# Patient Record
Sex: Female | Born: 1985 | Race: Black or African American | Hispanic: No | Marital: Single | Smoking: Never smoker
Health system: Southern US, Community
[De-identification: ages and names within clinical notes are randomized; demographics above are authoritative.]

## PROBLEM LIST (undated history)

## (undated) DIAGNOSIS — I10 Essential (primary) hypertension: Secondary | ICD-10-CM

## (undated) DIAGNOSIS — J45909 Unspecified asthma, uncomplicated: Secondary | ICD-10-CM

## (undated) DIAGNOSIS — E669 Obesity, unspecified: Secondary | ICD-10-CM

## (undated) DIAGNOSIS — E119 Type 2 diabetes mellitus without complications: Secondary | ICD-10-CM

## (undated) HISTORY — DX: Obesity, unspecified: E66.9

---

## 2007-05-10 ENCOUNTER — Emergency Department (HOSPITAL_COMMUNITY): Admission: EM | Admit: 2007-05-10 | Discharge: 2007-05-11 | Payer: Self-pay | Admitting: Emergency Medicine

## 2009-05-04 ENCOUNTER — Emergency Department (HOSPITAL_COMMUNITY): Admission: EM | Admit: 2009-05-04 | Discharge: 2009-05-04 | Payer: Self-pay | Admitting: Emergency Medicine

## 2009-07-21 ENCOUNTER — Emergency Department (HOSPITAL_COMMUNITY): Admission: EM | Admit: 2009-07-21 | Discharge: 2009-07-21 | Payer: Self-pay | Admitting: Emergency Medicine

## 2010-08-22 ENCOUNTER — Emergency Department (HOSPITAL_COMMUNITY)
Admission: EM | Admit: 2010-08-22 | Discharge: 2010-08-22 | Payer: Self-pay | Source: Home / Self Care | Admitting: Emergency Medicine

## 2010-11-30 LAB — CBC
HCT: 40.5 % (ref 36.0–46.0)
Hemoglobin: 13.7 g/dL (ref 12.0–15.0)
MCV: 80.4 fL (ref 78.0–100.0)
Platelets: 182 10*3/uL (ref 150–400)
RBC: 5.04 MIL/uL (ref 3.87–5.11)
WBC: 6.1 10*3/uL (ref 4.0–10.5)

## 2010-11-30 LAB — COMPREHENSIVE METABOLIC PANEL
BUN: 8 mg/dL (ref 6–23)
CO2: 24 mEq/L (ref 19–32)
Chloride: 105 mEq/L (ref 96–112)
Creatinine, Ser: 0.73 mg/dL (ref 0.4–1.2)
GFR calc non Af Amer: >60 mL/min (ref >60)
Glucose, Bld: 96 mg/dL (ref 70–99)
Total Bilirubin: 0.8 mg/dL (ref 0.3–1.2)

## 2010-11-30 LAB — DIFFERENTIAL
Basophils Absolute: 0 10*3/uL (ref 0.0–0.1)
Basophils Relative: 0 % (ref 0–1)
Lymphocytes Relative: 13 % (ref 12–46)
Neutro Abs: 5.1 10*3/uL (ref 1.7–7.7)
Neutrophils Relative %: 83 % — ABNORMAL HIGH (ref 43–77)

## 2010-11-30 LAB — URINALYSIS, ROUTINE W REFLEX MICROSCOPIC
Glucose, UA: NEGATIVE mg/dL
Hgb urine dipstick: NEGATIVE
Protein, ur: NEGATIVE mg/dL

## 2010-11-30 LAB — LIPASE, BLOOD: Lipase: 14 U/L (ref 11–59)

## 2010-12-02 LAB — WET PREP, GENITAL
WBC, Wet Prep HPF POC: NONE SEEN
Yeast Wet Prep HPF POC: NONE SEEN

## 2010-12-02 LAB — DIFFERENTIAL
Basophils Absolute: 0.1 10*3/uL (ref 0.0–0.1)
Basophils Relative: 2 % — ABNORMAL HIGH (ref 0–1)
Eosinophils Absolute: 0 10*3/uL (ref 0.0–0.7)
Monocytes Relative: 8 % (ref 3–12)
Neutro Abs: 1.2 10*3/uL — ABNORMAL LOW (ref 1.7–7.7)
Neutrophils Relative %: 34 % — ABNORMAL LOW (ref 43–77)

## 2010-12-02 LAB — URINALYSIS, ROUTINE W REFLEX MICROSCOPIC
Hgb urine dipstick: NEGATIVE
Ketones, ur: NEGATIVE mg/dL
Protein, ur: NEGATIVE mg/dL
Urobilinogen, UA: 1 mg/dL (ref 0.0–1.0)

## 2010-12-02 LAB — CBC
MCHC: 33.9 g/dL (ref 30.0–36.0)
MCV: 80.8 fL (ref 78.0–100.0)
Platelets: 200 10*3/uL (ref 150–400)
RBC: 4.49 MIL/uL (ref 3.87–5.11)

## 2010-12-02 LAB — GC/CHLAMYDIA PROBE AMP, GENITAL
Chlamydia, DNA Probe: POSITIVE — AB
GC Probe Amp, Genital: NEGATIVE

## 2010-12-02 LAB — BASIC METABOLIC PANEL
BUN: 11 mg/dL (ref 6–23)
CO2: 31 mEq/L (ref 19–32)
Calcium: 9 mg/dL (ref 8.4–10.5)
Creatinine, Ser: 0.78 mg/dL (ref 0.4–1.2)
GFR calc Af Amer: >60 mL/min (ref >60)

## 2011-09-12 ENCOUNTER — Encounter (HOSPITAL_COMMUNITY): Payer: Self-pay | Admitting: *Deleted

## 2011-09-12 ENCOUNTER — Emergency Department (HOSPITAL_COMMUNITY)
Admission: EM | Admit: 2011-09-12 | Discharge: 2011-09-12 | Disposition: A | Discharge status: Home / Self Care | Payer: Self-pay | Attending: Emergency Medicine | Admitting: Emergency Medicine

## 2011-09-12 DIAGNOSIS — K029 Dental caries, unspecified: Secondary | ICD-10-CM | POA: Insufficient documentation

## 2011-09-12 DIAGNOSIS — R51 Headache: Secondary | ICD-10-CM | POA: Insufficient documentation

## 2011-09-12 DIAGNOSIS — K0889 Other specified disorders of teeth and supporting structures: Secondary | ICD-10-CM

## 2011-09-12 DIAGNOSIS — K089 Disorder of teeth and supporting structures, unspecified: Secondary | ICD-10-CM | POA: Insufficient documentation

## 2011-09-12 MED ORDER — AMOXICILLIN 500 MG PO CAPS: 500.0000 mg | ORAL_CAPSULE | Freq: Three times a day (TID) | ORAL | Status: AC

## 2011-09-12 MED ORDER — OXYCODONE-ACETAMINOPHEN 5-325 MG PO TABS: 1.0000 | ORAL_TABLET | ORAL | Status: AC | PRN

## 2011-09-12 MED ORDER — OXYCODONE-ACETAMINOPHEN 5-325 MG PO TABS
1.0000 | ORAL_TABLET | Freq: Once | ORAL | Status: AC
  Administered 2011-09-12: 1 via ORAL
  Filled 2011-09-12: qty 1

## 2011-09-12 NOTE — ED Notes (Signed)
States that she has three teeth with holes and c/o pain to areas and headaches as well, ongoing for 3 months, last seen dentist last year

## 2011-09-13 NOTE — ED Provider Notes (Signed)
History     CSN: [id number]  Arrival date & time 09/12/11  1314   First MD Initiated Contact with Patient 09/12/11 1401      Chief Complaint  Patient presents with  . Dental Pain    (Consider location/radiation/quality/duration/timing/severity/associated sxs/prior treatment) Patient is a 26 y.o. female presenting with tooth pain. The history is provided by the patient. No language interpreter was used.  Dental PainThe primary symptoms include mouth pain and headaches. Primary symptoms do not include dental injury, oral bleeding, oral lesions, fever, shortness of breath, sore throat, angioedema or cough. The symptoms began more than 1 month ago. The symptoms are worsening. The symptoms are recurrent. The symptoms occur constantly.  Mouth pain began more than 1 month ago. Mouth pain occurs frequently. Mouth pain is unchanged. Affected locations include: teeth and gum(s).  The headache is not associated with neck stiffness.   Additional symptoms include: dental sensitivity to temperature and gum tenderness. Additional symptoms do not include: gum swelling, purulent gums, trismus, facial swelling, trouble swallowing, ear pain and swollen glands. Associated symptoms comments: Intermittent headaches.. Medical issues include: smoking and periodontal disease.    History reviewed. No pertinent past medical history.  History reviewed. No pertinent past surgical history.  History reviewed. No pertinent family history.  History  Substance Use Topics  . Smoking status: Current Everyday Smoker -- 0.2 packs/day    Types: Cigarettes  . Smokeless tobacco: Not on file  . Alcohol Use: No    OB History    Grav Para Term Preterm Abortions TAB SAB Ect Mult Living                  Review of Systems  Constitutional: Negative for fever and appetite change.  HENT: Positive for dental problem. Negative for ear pain, sore throat, facial swelling, trouble swallowing, neck pain and neck stiffness.     Respiratory: Negative for cough and shortness of breath.   Skin: Negative.   Neurological: Positive for headaches. Negative for dizziness.  Hematological: Negative for adenopathy.  All other systems reviewed and are negative.    Allergies  Review of patient's allergies indicates no known allergies.  Home Medications   Current Outpatient Rx  Name Route Sig Dispense Refill  . AMOXICILLIN 500 MG PO CAPS Oral Take 1 capsule (500 mg total) by mouth 3 (three) times daily. For 10 days 30 capsule 0  . OXYCODONE-ACETAMINOPHEN 5-325 MG PO TABS Oral Take 1 tablet by mouth every 4 (four) hours as needed for pain. 15 tablet 0    BP 155/89  Pulse 97  Temp 98.7 F (37.1 C)  Resp 18  Ht 5\' 9"  (1.753 m)  Wt 280 lb (127.007 kg)  BMI 41.35 kg/m2  SpO2 99%  LMP 08/14/2011  Physical Exam  Nursing note and vitals reviewed. Constitutional: She is oriented to person, place, and time.  HENT:  Head: Normocephalic and atraumatic. No trismus in the jaw.  Right Ear: Tympanic membrane and ear canal normal.  Left Ear: Tympanic membrane and ear canal normal.  Mouth/Throat: Uvula is midline, oropharynx is clear and moist and mucous membranes are normal. Dental caries present. No dental abscesses or uvula swelling.       Multiple dental caries with widespread dental disease, discoloration of several teeth.  No obvious dental abscess, no facial swelling or trismus  Eyes: EOM are normal. Pupils are equal, round, and reactive to light.  Neck: Normal range of motion. Neck supple. No Brudzinski's sign and no Kernig's sign  noted. No thyromegaly present.  Cardiovascular: Normal rate, regular rhythm and normal heart sounds.   No murmur heard. Pulmonary/Chest: Effort normal and breath sounds normal.  Lymphadenopathy:    She has no cervical adenopathy.  Neurological: She is alert and oriented to person, place, and time. She exhibits normal muscle tone. Coordination normal.  Skin: Skin is warm and dry.    ED  Course  Procedures (including critical care time)    1. Pain, dental       MDM   Chronic dental pain with multiple dental caries.  Vitals stable.  Non-toxic appearing.  No meningeal signs.  Headaches are likely secondary to dental pain.         Ivin Rosenbloom L. [place], [place] 09/13/11 2015

## 2011-09-18 NOTE — ED Provider Notes (Signed)
Medical screening examination/treatment/procedure(s) were performed by non-physician practitioner and as supervising physician I was immediately available for consultation/collaboration.  Sammie Denner S. Jashay Roddy, MD 09/18/11 1011 

## 2012-03-06 ENCOUNTER — Emergency Department (HOSPITAL_COMMUNITY)
Admission: EM | Admit: 2012-03-06 | Discharge: 2012-03-06 | Disposition: A | Discharge status: Home / Self Care | Payer: Medicaid Other | Attending: Emergency Medicine | Admitting: Emergency Medicine

## 2012-03-06 ENCOUNTER — Emergency Department (HOSPITAL_COMMUNITY): Payer: Medicaid Other

## 2012-03-06 ENCOUNTER — Encounter (HOSPITAL_COMMUNITY): Payer: Self-pay | Admitting: *Deleted

## 2012-03-06 DIAGNOSIS — R51 Headache: Secondary | ICD-10-CM | POA: Insufficient documentation

## 2012-03-06 DIAGNOSIS — Z79899 Other long term (current) drug therapy: Secondary | ICD-10-CM | POA: Insufficient documentation

## 2012-03-06 DIAGNOSIS — F17200 Nicotine dependence, unspecified, uncomplicated: Secondary | ICD-10-CM | POA: Insufficient documentation

## 2012-03-06 LAB — BASIC METABOLIC PANEL
BUN: 12 mg/dL (ref 6–23)
Calcium: 9.2 mg/dL (ref 8.4–10.5)
Chloride: 105 mEq/L (ref 96–112)
Creatinine, Ser: 0.89 mg/dL (ref 0.50–1.10)
GFR calc Af Amer: >90 mL/min (ref >90)

## 2012-03-06 LAB — CBC WITH DIFFERENTIAL/PLATELET
Basophils Absolute: 0 10*3/uL (ref 0.0–0.1)
Basophils Relative: 1 % (ref 0–1)
Eosinophils Absolute: 0.1 10*3/uL (ref 0.0–0.7)
Eosinophils Relative: 1 % (ref 0–5)
HCT: 39 % (ref 36.0–46.0)
Hemoglobin: 12.7 g/dL (ref 12.0–15.0)
MCH: 26.4 pg (ref 26.0–34.0)
MCHC: 32.6 g/dL (ref 30.0–36.0)
MCV: 81.1 fL (ref 78.0–100.0)
Monocytes Absolute: 0.3 10*3/uL (ref 0.1–1.0)
Monocytes Relative: 7 % (ref 3–12)
RDW: 13.2 % (ref 11.5–15.5)

## 2012-03-06 LAB — URINALYSIS, ROUTINE W REFLEX MICROSCOPIC
Bilirubin Urine: NEGATIVE
Ketones, ur: NEGATIVE mg/dL
Leukocytes, UA: NEGATIVE
Nitrite: NEGATIVE
Protein, ur: NEGATIVE mg/dL
pH: 6 (ref 5.0–8.0)

## 2012-03-06 MED ORDER — SODIUM CHLORIDE 0.9 % IV SOLN: INTRAVENOUS | Status: DC

## 2012-03-06 MED ORDER — HYDROCODONE-ACETAMINOPHEN 5-325 MG PO TABS: 1.0000 | ORAL_TABLET | Freq: Four times a day (QID) | ORAL | Status: AC | PRN

## 2012-03-06 MED ORDER — ONDANSETRON HCL 4 MG/2ML IJ SOLN
4.0000 mg | Freq: Once | INTRAMUSCULAR | Status: AC
  Administered 2012-03-06: 4 mg via INTRAVENOUS
  Filled 2012-03-06: qty 2

## 2012-03-06 MED ORDER — NAPROXEN 500 MG PO TABS: 500.0000 mg | ORAL_TABLET | Freq: Two times a day (BID) | ORAL | Status: DC

## 2012-03-06 MED ORDER — SODIUM CHLORIDE 0.9 % IV BOLUS (SEPSIS)
250.0000 mL | Freq: Once | INTRAVENOUS | Status: AC
  Administered 2012-03-06: 1000 mL via INTRAVENOUS

## 2012-03-06 NOTE — ED Provider Notes (Signed)
History  This chart was scribed for Shelda Jakes, MD by Erskine Emery. This patient was seen in room [facility]09/[facility]09 and the patient's care was started at 11:32.   CSN: [id number]  Arrival date & time 03/06/12  1106   First MD Initiated Contact with Patient 03/06/12 1132      Chief Complaint  Patient presents with  . Headache    (Consider location/radiation/quality/duration/timing/severity/associated sxs/prior treatment) HPI  Ruth Campbell is a 26 y.o. female who presents to the Emergency Department complaining of an intermittent frontal (equal bilaterally) throbbing headache of 6-7/10 severity for almost a month that gets worse and constant at night. Pt denies any associated nausea, diarrhea, vominting, fever, sore throat, congestion, visual changes, photophobia, chest pain, SOB, cough, abdominal pain, dysuria, rash, or neck pain, but reports associated intermittent back pain for about 2 days. Pt denies a h/o migraines, head injury, or bleeding easily but reports a h/o mild sinus headaches. Pt has been given medication since arriving to the ED with no relief.  Dr. Dimas Aguas is pt's PCP.    History reviewed. No pertinent past medical history.  History reviewed. No pertinent past surgical history.  History reviewed. No pertinent family history.  History  Substance Use Topics  . Smoking status: Current Everyday Smoker -- 0.2 packs/day    Types: Cigarettes  . Smokeless tobacco: Not on file  . Alcohol Use: No    OB History    Grav Para Term Preterm Abortions TAB SAB Ect Mult Living                  Review of Systems  Constitutional: Negative for fever.  HENT: Negative for congestion, sore throat and neck pain.   Eyes: Negative for photophobia and visual disturbance.  Respiratory: Negative for cough and shortness of breath.   Cardiovascular: Negative for chest pain.  Gastrointestinal: Negative for nausea, vomiting, abdominal pain and diarrhea.  Genitourinary: Negative for  dysuria.  Neurological: Positive for headaches.  All other systems reviewed and are negative.    Allergies  Review of patient's allergies indicates no known allergies.  Home Medications   Current Outpatient Rx  Name Route Sig Dispense Refill  . HYDROCODONE-ACETAMINOPHEN 5-325 MG PO TABS Oral Take 1-2 tablets by mouth every 6 (six) hours as needed for pain. 10 tablet 0  . NAPROXEN 500 MG PO TABS Oral Take 1 tablet (500 mg total) by mouth 2 (two) times daily. 14 tablet 0    Triage Vitals: BP 153/132  Pulse 91  Temp 98.5 F (36.9 C) (Oral)  Resp 20  Ht 5\' 8"  (1.727 m)  Wt 250 lb (113.399 kg)  BMI 38.01 kg/m2  SpO2 97%  LMP 02/07/2012  Physical Exam  Nursing note and vitals reviewed. Constitutional: She is oriented to person, place, and time. She appears well-developed and well-nourished. No distress.  HENT:  Head: Normocephalic and atraumatic.  Eyes: EOM are normal. Pupils are equal, round, and reactive to light.  Neck: Neck supple. No tracheal deviation present.  Cardiovascular: Normal rate, regular rhythm and normal heart sounds.   No murmur heard.      High blood pressure.   Pulmonary/Chest: Effort normal and breath sounds normal. No respiratory distress.  Abdominal: Soft. Bowel sounds are normal. She exhibits no distension. There is no tenderness.  Musculoskeletal: Normal range of motion. She exhibits no edema.       No numbness or swelling in legs.   Lymphadenopathy:    She has no cervical adenopathy.  Neurological:  She is alert and oriented to person, place, and time. No cranial nerve deficit. Coordination normal.  Skin: Skin is warm and dry.  Psychiatric: She has a normal mood and affect. Her behavior is normal.    ED Course  Procedures (including critical care time)  DIAGNOSTIC STUDIES: Oxygen Saturation is 97% on room air, adequate by my interpretation.    COORDINATION OF CARE:  12:50--I discussed treatment plan including pain medication with pt and pt  agreed.    Labs Reviewed  CBC WITH DIFFERENTIAL - Abnormal; Notable for the following:    WBC 3.7 (*)     Neutrophils Relative 38 (*)     Neutro Abs 1.4 (*)     Lymphocytes Relative 52 (*)     All other components within normal limits  BASIC METABOLIC PANEL - Abnormal; Notable for the following:    Glucose, Bld 109 (*)     GFR calc non Af Amer 90 (*)     All other components within normal limits  URINALYSIS, ROUTINE W REFLEX MICROSCOPIC  PREGNANCY, URINE   Ct Head Wo Contrast  03/06/2012  *RADIOLOGY REPORT*  Clinical Data: Headache.  No injury.  CT HEAD WITHOUT CONTRAST  Technique:  Contiguous axial images were obtained from the base of the skull through the vertex without contrast.  Comparison: None.  Findings: No mass lesion, mass effect, midline shift, hydrocephalus, hemorrhage.  No territorial ischemia or acute infarction.  Left mastoid fluid is present with sclerosis of the left mastoid bone suggesting remote mastoiditis.  IMPRESSION: No acute intracranial abnormality.  Original Report Authenticated By: Andreas Newport, M.D.   Results for orders placed during the hospital encounter of 03/06/12  CBC WITH DIFFERENTIAL      Component Value Range   WBC 3.7 (*) 4.0 - 10.5 K/uL   RBC 4.81  3.87 - 5.11 MIL/uL   Hemoglobin 12.7  12.0 - 15.0 g/dL   HCT 95.6  21.3 - 08.6 %   MCV 81.1  78.0 - 100.0 fL   MCH 26.4  26.0 - 34.0 pg   MCHC 32.6  30.0 - 36.0 g/dL   RDW 57.8  46.9 - 62.9 %   Platelets 198  150 - 400 K/uL   Neutrophils Relative 38 (*) 43 - 77 %   Neutro Abs 1.4 (*) 1.7 - 7.7 K/uL   Lymphocytes Relative 52 (*) 12 - 46 %   Lymphs Abs 1.9  0.7 - 4.0 K/uL   Monocytes Relative 7  3 - 12 %   Monocytes Absolute 0.3  0.1 - 1.0 K/uL   Eosinophils Relative 1  0 - 5 %   Eosinophils Absolute 0.1  0.0 - 0.7 K/uL   Basophils Relative 1  0 - 1 %   Basophils Absolute 0.0  0.0 - 0.1 K/uL  BASIC METABOLIC PANEL      Component Value Range   Sodium 139  135 - 145 mEq/L   Potassium 4.0   3.5 - 5.1 mEq/L   Chloride 105  96 - 112 mEq/L   CO2 25  19 - 32 mEq/L   Glucose, Bld 109 (*) 70 - 99 mg/dL   BUN 12  6 - 23 mg/dL   Creatinine, Ser 5.28  0.50 - 1.10 mg/dL   Calcium 9.2  8.4 - 41.3 mg/dL   GFR calc non Af Amer 90 (*) >90 mL/min   GFR calc Af Amer >90  >90 mL/min  URINALYSIS, ROUTINE W REFLEX MICROSCOPIC  Component Value Range   Color, Urine YELLOW  YELLOW   APPearance CLEAR  CLEAR   Specific Gravity, Urine 1.030  1.005 - 1.030   pH 6.0  5.0 - 8.0   Glucose, UA NEGATIVE  NEGATIVE mg/dL   Hgb urine dipstick NEGATIVE  NEGATIVE   Bilirubin Urine NEGATIVE  NEGATIVE   Ketones, ur NEGATIVE  NEGATIVE mg/dL   Protein, ur NEGATIVE  NEGATIVE mg/dL   Urobilinogen, UA 0.2  0.0 - 1.0 mg/dL   Nitrite NEGATIVE  NEGATIVE   Leukocytes, UA NEGATIVE  NEGATIVE  PREGNANCY, URINE      Component Value Range   Preg Test, Ur NEGATIVE  NEGATIVE     1. Headache       MDM  Workup negative negative head CT urinalysis normal no pregnancy electrolytes without any sniffing abnormalities no leukocytosis no significant anemia. Patient followup with her primary care Dr. If symptoms persist will most likely which they have been require an MRI as an outpatient. No evidence of sinusitis no evidence of acute intracranial problem. MRI of the warned to rule out neoplastic process. Patient in no acute distress in the emergency department nontoxic.  I personally performed the services described in this documentation, which was scribed in my presence. The recorded information has been reviewed and considered.          Shelda Jakes, MD 03/06/12 [phone]

## 2012-03-06 NOTE — ED Notes (Signed)
Frontal headache x 1 month. Increases at night,.  No N/V  No injury.  No hx of migraines.

## 2012-03-06 NOTE — ED Notes (Signed)
Pt stated she was feeling a little better, pointed to pain on left side of head.  Friend at bedside.

## 2012-03-06 NOTE — ED Notes (Signed)
Pt given cup of water, delay explained

## 2012-04-11 ENCOUNTER — Encounter (HOSPITAL_COMMUNITY): Payer: Self-pay | Admitting: *Deleted

## 2012-04-11 ENCOUNTER — Emergency Department (HOSPITAL_COMMUNITY)
Admission: EM | Admit: 2012-04-11 | Discharge: 2012-04-11 | Disposition: A | Discharge status: Home / Self Care | Payer: Medicaid Other | Attending: Emergency Medicine | Admitting: Emergency Medicine

## 2012-04-11 DIAGNOSIS — F17200 Nicotine dependence, unspecified, uncomplicated: Secondary | ICD-10-CM | POA: Insufficient documentation

## 2012-04-11 DIAGNOSIS — R51 Headache: Secondary | ICD-10-CM | POA: Insufficient documentation

## 2012-04-11 DIAGNOSIS — J069 Acute upper respiratory infection, unspecified: Secondary | ICD-10-CM | POA: Insufficient documentation

## 2012-04-11 MED ORDER — OXYMETAZOLINE HCL 0.05 % NA SOLN
1.0000 | Freq: Once | NASAL | Status: AC
  Administered 2012-04-11: 1 via NASAL
  Filled 2012-04-11: qty 15

## 2012-04-11 MED ORDER — LORATADINE 10 MG PO TABS
10.0000 mg | ORAL_TABLET | Freq: Every day | ORAL | Status: DC
  Administered 2012-04-11: 10 mg via ORAL
  Filled 2012-04-11: qty 1

## 2012-04-11 MED ORDER — PSEUDOEPHEDRINE HCL 60 MG PO TABS
60.0000 mg | ORAL_TABLET | Freq: Once | ORAL | Status: AC
  Administered 2012-04-11: 60 mg via ORAL
  Filled 2012-04-11: qty 1

## 2012-04-11 MED ORDER — IBUPROFEN 800 MG PO TABS
800.0000 mg | ORAL_TABLET | Freq: Once | ORAL | Status: AC
  Administered 2012-04-11: 800 mg via ORAL
  Filled 2012-04-11: qty 1

## 2012-04-11 NOTE — ED Provider Notes (Signed)
History     CSN: [id number]  Arrival date & time 04/11/12  2016   First MD Initiated Contact with Patient 04/11/12 2238      Chief Complaint  Patient presents with  . Headache    (Consider location/radiation/quality/duration/timing/severity/associated sxs/prior treatment) HPI Comments: Has been having "cold sxs" ~3 weeks.  No fever or  Chills.  Headache has been present x 3 days.  It is diffuse and dull..  She partially attributes it to her seasonal headaches.    Patient is a 26 y.o. female presenting with headaches. The history is provided by the patient. No language interpreter was used.  Headache  This is a new problem. The problem occurs constantly. The headache is associated with nothing. The quality of the pain is described as throbbing. The pain does not radiate. Pertinent negatives include no fever, no nausea and no vomiting. She has tried acetaminophen for the symptoms. The treatment provided no relief.    History reviewed. No pertinent past medical history.  History reviewed. No pertinent past surgical history.  History reviewed. No pertinent family history.  History  Substance Use Topics  . Smoking status: Current Everyday Smoker -- 0.2 packs/day    Types: Cigarettes  . Smokeless tobacco: Not on file  . Alcohol Use: No    OB History    Grav Para Term Preterm Abortions TAB SAB Ect Mult Living                  Review of Systems  Constitutional: Negative for fever.  HENT: Positive for congestion. Negative for ear pain, sore throat, sneezing and trouble swallowing.   Eyes: Negative for photophobia.  Respiratory: Negative for cough.   Gastrointestinal: Negative for nausea, vomiting and diarrhea.  Neurological: Positive for headaches.  All other systems reviewed and are negative.    Allergies  Review of patient's allergies indicates no known allergies.  Home Medications   Current Outpatient Rx  Name Route Sig Dispense Refill  . ACETAMINOPHEN 500 MG PO  TABS Oral Take 1,000 mg by mouth as needed.      BP 160/93  Pulse 76  Temp 98.1 F (36.7 C) (Oral)  Resp 20  Ht 5\' 7"  (1.702 m)  Wt 250 lb (113.399 kg)  BMI 39.16 kg/m2  SpO2 100%  LMP 02/19/2012  Physical Exam  Nursing note and vitals reviewed. Constitutional: She is oriented to person, place, and time. She appears well-developed and well-nourished. No distress.  HENT:  Head: Normocephalic and atraumatic.  Right Ear: Hearing, tympanic membrane, external ear and ear canal normal.  Left Ear: Hearing, tympanic membrane, external ear and ear canal normal.  Nose: No rhinorrhea.  Mouth/Throat: Uvula is midline and oropharynx is clear and moist. No uvula swelling.       Nasal congestion   Eyes: EOM are normal.  Neck: Trachea normal and normal range of motion.  Cardiovascular: Normal rate, regular rhythm and normal heart sounds.   Pulmonary/Chest: Effort normal and breath sounds normal. No accessory muscle usage. No respiratory distress.  Abdominal: Soft. She exhibits no distension. There is no tenderness.  Musculoskeletal: Normal range of motion.  Neurological: She is alert and oriented to person, place, and time.  Skin: Skin is warm and dry.  Psychiatric: She has a normal mood and affect. Judgment normal.    ED Course  Procedures (including critical care time)  Labs Reviewed - No data to display No results found.   1. Headache   2. URI (upper respiratory infection)  MDM  Sudafed claritin Afrin spray.        Evalina Field, [place] 04/11/12 2337

## 2012-04-11 NOTE — ED Notes (Signed)
Pt c/o headache, nasal congestion, and sore throat. Pt denies cough and NVD. Pt states symptoms have been present four about a week.

## 2012-04-11 NOTE — ED Notes (Signed)
Nasal congestion, headache, sl sore throat, no cough, No N/V

## 2012-04-12 NOTE — ED Provider Notes (Signed)
Medical screening examination/treatment/procedure(s) were performed by non-physician practitioner and as supervising physician I was immediately available for consultation/collaboration. Devoria Albe, MD, Armando Gang   Ward Givens, MD 04/12/12 [phone]

## 2012-06-19 ENCOUNTER — Encounter (HOSPITAL_COMMUNITY): Payer: Self-pay | Admitting: *Deleted

## 2012-06-19 ENCOUNTER — Emergency Department (HOSPITAL_COMMUNITY)
Admission: EM | Admit: 2012-06-19 | Discharge: 2012-06-19 | Disposition: A | Discharge status: Home / Self Care | Payer: Medicaid Other | Attending: Emergency Medicine | Admitting: Emergency Medicine

## 2012-06-19 DIAGNOSIS — F17200 Nicotine dependence, unspecified, uncomplicated: Secondary | ICD-10-CM | POA: Insufficient documentation

## 2012-06-19 DIAGNOSIS — N390 Urinary tract infection, site not specified: Secondary | ICD-10-CM

## 2012-06-19 LAB — PREGNANCY, URINE: Preg Test, Ur: NEGATIVE

## 2012-06-19 LAB — URINALYSIS, ROUTINE W REFLEX MICROSCOPIC
Hgb urine dipstick: NEGATIVE
Leukocytes, UA: NEGATIVE
Nitrite: POSITIVE — AB
Protein, ur: NEGATIVE mg/dL
Specific Gravity, Urine: 1.025 (ref 1.005–1.030)
Urobilinogen, UA: 0.2 mg/dL (ref 0.0–1.0)

## 2012-06-19 LAB — WET PREP, GENITAL: Clue Cells Wet Prep HPF POC: NONE SEEN

## 2012-06-19 LAB — URINE MICROSCOPIC-ADD ON

## 2012-06-19 MED ORDER — CEPHALEXIN 500 MG PO CAPS: 500.0000 mg | ORAL_CAPSULE | Freq: Four times a day (QID) | ORAL | Status: DC

## 2012-06-19 MED ORDER — NAPROXEN 250 MG PO TABS: 250.0000 mg | ORAL_TABLET | Freq: Two times a day (BID) | ORAL | Status: DC

## 2012-06-19 NOTE — ED Notes (Signed)
abd and lower back pain, for 2 days.  Has ?lesion on lt 5th finger she wants checked.Nausea, with vomiting this am, no diarrhea

## 2012-06-19 NOTE — ED Provider Notes (Signed)
History     CSN: [id number]  Arrival date & time 06/19/12  1337   First MD Initiated Contact with Patient 06/19/12 1354      Chief Complaint  Patient presents with  . Abdominal Pain     HPI Pt was seen at 1355.  Per pt, c/o gradual onset and persistence of constant lower abd "pain" for the past 2 days.  Has been associated with one episode of N/V this morning.  Denies flank pain, no diarrhea, no CP/SOB, no fevers, no rash, no vaginal bleeding/discharge, no black or blood in emesis.    History reviewed. No pertinent past medical history.  History reviewed. No pertinent past surgical history.   History  Substance Use Topics  . Smoking status: Current Every Day Smoker -- 0.2 packs/day    Types: Cigarettes  . Smokeless tobacco: Not on file  . Alcohol Use: No    Review of Systems ROS: Statement: All systems negative except as marked or noted in the HPI; Constitutional: Negative for fever and chills. ; ; Eyes: Negative for eye pain, redness and discharge. ; ; ENMT: Negative for ear pain, hoarseness, nasal congestion, sinus pressure and sore throat. ; ; Cardiovascular: Negative for chest pain, palpitations, diaphoresis, dyspnea and peripheral edema. ; ; Respiratory: Negative for cough, wheezing and stridor. ; ; Gastrointestinal: +N/V, abd pain. Negative for diarrhea, blood in stool, hematemesis, jaundice and rectal bleeding. . ; ; Genitourinary: Negative for dysuria, flank pain and hematuria. ; ; GYN:  No vaginal bleeding, no vaginal discharge, no vulvar pain.;; Musculoskeletal: Negative for back pain and neck pain. Negative for swelling and trauma.; ; Skin: Negative for pruritus, rash, abrasions, blisters, bruising and skin lesion.; ; Neuro: Negative for headache, lightheadedness and neck stiffness. Negative for weakness, altered level of consciousness , altered mental status, extremity weakness, paresthesias, involuntary movement, seizure and syncope.       Allergies  Review of  patient's allergies indicates no known allergies.  Home Medications  No current outpatient prescriptions on file.  BP 150/99  Pulse 92  Temp 98.3 F (36.8 C) (Oral)  Resp 20  Ht 5\' 7"  (1.702 m)  Wt 250 lb (113.399 kg)  BMI 39.16 kg/m2  SpO2 99%  LMP 02/18/2012  Physical Exam 1400: Physical examination:  Nursing notes reviewed; Vital signs and O2 SAT reviewed;  Constitutional: Well developed, Well nourished, Well hydrated, In no acute distress; Head:  Normocephalic, atraumatic; Eyes: EOMI, PERRL, No scleral icterus; ENMT: Mouth and pharynx normal, Mucous membranes moist; Neck: Supple, Full range of motion, No lymphadenopathy; Cardiovascular: Regular rate and rhythm, No murmur, rub, or gallop; Respiratory: Breath sounds clear & equal bilaterally, No rales, rhonchi, wheezes.  Speaking full sentences with ease, Normal respiratory effort/excursion; Chest: Nontender, Movement normal; Abdomen: Soft, +very mild suprapubic tenderness to palp. Nondistended, Normal bowel sounds; Genitourinary: No CVA tenderness; Pelvic exam performed with permission of pt and female ED tech assist during exam.  External genitalia w/o lesions. Vaginal vault with white discharge.  Cervix w/o lesions, not friable, GC/chlam and wet prep obtained and sent to lab.  Bimanual exam w/o CMT, uterine or adnexal tenderness.;; Spine:  No midline CS, TS, LS tenderness.;;  Extremities: Pulses normal, No tenderness, No edema, No calf edema or asymmetry.; Neuro: AA&Ox3, Major CN grossly intact.  Speech clear. No gross focal motor or sensory deficits in extremities.; Skin: Color normal, Warm, Dry.   ED Course  Procedures    MDM  MDM Reviewed: nursing note and vitals Interpretation: labs  Results for orders placed during the hospital encounter of 06/19/12  PREGNANCY, URINE      Component Value Range   Preg Test, Ur NEGATIVE  NEGATIVE  URINALYSIS, ROUTINE W REFLEX MICROSCOPIC      Component Value Range   Color, Urine YELLOW   YELLOW   APPearance CLOUDY (*) CLEAR   Specific Gravity, Urine 1.025  1.005 - 1.030   pH 6.0  5.0 - 8.0   Glucose, UA NEGATIVE  NEGATIVE mg/dL   Hgb urine dipstick NEGATIVE  NEGATIVE   Bilirubin Urine NEGATIVE  NEGATIVE   Ketones, ur NEGATIVE  NEGATIVE mg/dL   Protein, ur NEGATIVE  NEGATIVE mg/dL   Urobilinogen, UA 0.2  0.0 - 1.0 mg/dL   Nitrite POSITIVE (*) NEGATIVE   Leukocytes, UA NEGATIVE  NEGATIVE  WET PREP, GENITAL      Component Value Range   Yeast Wet Prep HPF POC NONE SEEN  NONE SEEN   Trich, Wet Prep NONE SEEN  NONE SEEN   Clue Cells Wet Prep HPF POC NONE SEEN  NONE SEEN   WBC, Wet Prep HPF POC FEW (*) NONE SEEN  URINE MICROSCOPIC-ADD ON      Component Value Range   Squamous Epithelial / LPF FEW (*) RARE   WBC, UA 0-2  <3 WBC/hpf   Bacteria, UA MANY (*) RARE     1520:  Asking for me to look at her left 5th finger, states she has had "a bump on it for a while" that she "pops but it keeps coming back."  Left dorsal 5th finger PIP area with small superficial abrasion and what appears to be a wart. FROM without pain. No fluctuance, no drainage, no erythema, no edema, no tenderness along flexor or extensor tendon.  Will tx for UTI.  GC/chlam pending.  Abd continues benign on several re-exams. No CVAT bilat. No N/V while in the ED.  Pt wants to go home now.  Dx and testing d/w pt and family.  Questions answered.  Verb understanding, agreeable to d/c home with outpt f/u.            Laray Anger, DO 06/19/12 1936

## 2012-06-20 LAB — GC/CHLAMYDIA PROBE AMP, GENITAL
Chlamydia, DNA Probe: NEGATIVE
GC Probe Amp, Genital: NEGATIVE

## 2012-08-06 ENCOUNTER — Encounter (HOSPITAL_COMMUNITY): Payer: Self-pay | Admitting: *Deleted

## 2012-08-06 ENCOUNTER — Emergency Department (HOSPITAL_COMMUNITY)
Admission: EM | Admit: 2012-08-06 | Discharge: 2012-08-06 | Disposition: A | Discharge status: Home / Self Care | Payer: Medicaid Other | Attending: Emergency Medicine | Admitting: Emergency Medicine

## 2012-08-06 DIAGNOSIS — J029 Acute pharyngitis, unspecified: Secondary | ICD-10-CM | POA: Insufficient documentation

## 2012-08-06 DIAGNOSIS — B9789 Other viral agents as the cause of diseases classified elsewhere: Secondary | ICD-10-CM | POA: Insufficient documentation

## 2012-08-06 DIAGNOSIS — B349 Viral infection, unspecified: Secondary | ICD-10-CM

## 2012-08-06 DIAGNOSIS — Z87891 Personal history of nicotine dependence: Secondary | ICD-10-CM | POA: Insufficient documentation

## 2012-08-06 DIAGNOSIS — IMO0001 Reserved for inherently not codable concepts without codable children: Secondary | ICD-10-CM | POA: Insufficient documentation

## 2012-08-06 MED ORDER — HYDROCOD POLST-CHLORPHEN POLST 10-8 MG/5ML PO LQCR: 5.0000 mL | Freq: Two times a day (BID) | ORAL | Status: DC | PRN

## 2012-08-06 NOTE — ED Notes (Signed)
C/o productive cough, body aches, headache, and sore throat x 2 days. Denies nausea, but states has been coughing to the point of vomiting at times.

## 2012-08-06 NOTE — ED Provider Notes (Signed)
History     CSN: [id number]  Arrival date & time 08/06/12  1601   First MD Initiated Contact with Patient 08/06/12 1759      Chief Complaint  Patient presents with  . Generalized Body Aches  . Cough  . Headache     Patient is a 26 y.o. female presenting with cough. The history is provided by the patient.  Cough The current episode started 2 days ago. The problem occurs hourly. The problem has been gradually worsening. The cough is non-productive. Associated symptoms include chills, sweats and sore throat. Treatments tried: rest. The treatment provided no relief.  pt reports cough, nasal congestion ,sore throat, chills and headache over past 2 days No SOB is reported She reports chest wall pain from coughing She has had some post tussive emesis  PMH - none  History reviewed. No pertinent past surgical history.  No family history on file.  History  Substance Use Topics  . Smoking status: Former Games developer  . Smokeless tobacco: Not on file  . Alcohol Use: No    OB History    Grav Para Term Preterm Abortions TAB SAB Ect Mult Living                  Review of Systems  Constitutional: Positive for chills.  HENT: Positive for sore throat.   Respiratory: Positive for cough.     Allergies  Review of patient's allergies indicates no known allergies.  Home Medications   Current Outpatient Rx  Name  Route  Sig  Dispense  Refill  . HYDROCOD POLST-CPM POLST ER 10-8 MG/5ML PO LQCR   Oral   Take 5 mLs by mouth every 12 (twelve) hours as needed.   115 mL   0     BP 129/71  Pulse 104  Temp 99.9 F (37.7 C) (Oral)  Resp 18  Ht 5\' 8"  (1.727 m)  Wt 250 lb (113.399 kg)  BMI 38.01 kg/m2  SpO2 99%  LMP 07/12/2012  Physical Exam CONSTITUTIONAL: Well developed/well nourished HEAD AND FACE: Normocephalic/atraumatic EYES: EOMI/PERRL ENMT: Mucous membranes moist, uvula midline, pharynx normal NECK: supple no meningeal signs CV: S1/S2 noted, no murmurs/rubs/gallops  noted Chest - tender to palpation but no crepitance LUNGS: Lungs are clear to auscultation bilaterally, no apparent distress ABDOMEN: soft, nontender, no rebound or guarding NEURO: Pt is awake/alert, moves all extremitiesx4 EXTREMITIES: pulses normal, full ROM SKIN: warm, color normal PSYCH: no abnormalities of mood noted  ED Course  Procedures   1. Viral syndrome    Pt well appearing, no distress, lung sounds clear, do not feel imaging/further testing is warranted Stable for d/c   MDM  Nursing notes including past medical history and social history reviewed and considered in documentation         Joya Gaskins, MD 08/06/12 1849

## 2014-03-18 ENCOUNTER — Encounter (HOSPITAL_COMMUNITY): Payer: Self-pay | Admitting: Emergency Medicine

## 2014-03-18 ENCOUNTER — Emergency Department (HOSPITAL_COMMUNITY)
Admission: EM | Admit: 2014-03-18 | Discharge: 2014-03-18 | Disposition: A | Discharge status: Home / Self Care | Payer: Medicaid Other | Attending: Emergency Medicine | Admitting: Emergency Medicine

## 2014-03-18 DIAGNOSIS — S81009A Unspecified open wound, unspecified knee, initial encounter: Secondary | ICD-10-CM | POA: Insufficient documentation

## 2014-03-18 DIAGNOSIS — Z23 Encounter for immunization: Secondary | ICD-10-CM | POA: Diagnosis not present

## 2014-03-18 DIAGNOSIS — T798XXA Other early complications of trauma, initial encounter: Secondary | ICD-10-CM

## 2014-03-18 DIAGNOSIS — Z87891 Personal history of nicotine dependence: Secondary | ICD-10-CM | POA: Diagnosis not present

## 2014-03-18 DIAGNOSIS — Y929 Unspecified place or not applicable: Secondary | ICD-10-CM | POA: Insufficient documentation

## 2014-03-18 DIAGNOSIS — S91009A Unspecified open wound, unspecified ankle, initial encounter: Principal | ICD-10-CM

## 2014-03-18 DIAGNOSIS — Z79899 Other long term (current) drug therapy: Secondary | ICD-10-CM | POA: Diagnosis not present

## 2014-03-18 DIAGNOSIS — S81809A Unspecified open wound, unspecified lower leg, initial encounter: Secondary | ICD-10-CM | POA: Diagnosis not present

## 2014-03-18 DIAGNOSIS — W268XXA Contact with other sharp object(s), not elsewhere classified, initial encounter: Secondary | ICD-10-CM | POA: Diagnosis not present

## 2014-03-18 DIAGNOSIS — Y9389 Activity, other specified: Secondary | ICD-10-CM | POA: Insufficient documentation

## 2014-03-18 DIAGNOSIS — I10 Essential (primary) hypertension: Secondary | ICD-10-CM | POA: Diagnosis not present

## 2014-03-18 DIAGNOSIS — J45909 Unspecified asthma, uncomplicated: Secondary | ICD-10-CM | POA: Diagnosis not present

## 2014-03-18 HISTORY — DX: Essential (primary) hypertension: I10

## 2014-03-18 HISTORY — DX: Unspecified asthma, uncomplicated: J45.909

## 2014-03-18 MED ORDER — SULFAMETHOXAZOLE-TRIMETHOPRIM 800-160 MG PO TABS: 1.0000 | ORAL_TABLET | Freq: Two times a day (BID) | ORAL | Status: DC

## 2014-03-18 MED ORDER — TETANUS-DIPHTH-ACELL PERTUSSIS 5-2.5-18.5 LF-MCG/0.5 IM SUSP
0.5000 mL | Freq: Once | INTRAMUSCULAR | Status: AC
  Administered 2014-03-18: 0.5 mL via INTRAMUSCULAR
  Filled 2014-03-18: qty 0.5

## 2014-03-18 NOTE — ED Notes (Signed)
Pt reports cutting her RLL Saturday on a metal air conditioning vent. Wound open, no bleeding, redness noted around area. Pt reports clear/yellowish drainage.

## 2014-03-18 NOTE — Discharge Instructions (Signed)
Wound Infection A wound infection happens when a type of germ (bacteria) starts growing in the wound. In some cases, this can cause the wound to break open. If cared for properly, the infected wound will heal from the inside to the outside. Wound infections need treatment. CAUSES An infection is caused by bacteria growing in the wound.  SYMPTOMS   Increase in redness, swelling, or pain at the wound site.  Increase in drainage at the wound site.  Wound or bandage (dressing) starts to smell bad.  Fever.  Feeling tired or fatigued.  Pus draining from the wound. TREATMENT  Your health care provider will prescribe antibiotic medicine. The wound infection should improve within 24 to 48 hours. Any redness around the wound should stop spreading and the wound should be less painful.  HOME CARE INSTRUCTIONS   Only take over-the-counter or prescription medicines for pain, discomfort, or fever as directed by your health care provider.  Take your antibiotics as directed. Finish them even if you start to feel better.  Gently wash the area with mild soap and water 2 times a day, or as directed. Rinse off the soap. Pat the area dry with a clean towel. Do not rub the wound. This may cause bleeding.  Follow your health care provider's instructions for how often you need to change the dressing.  Apply ointment and a dressing to the wound as directed.  If the dressing sticks, moisten it with soapy water and gently remove it.  Change the bandage right away if it becomes wet, dirty, or develops a bad smell.  Take showers. Do not take tub baths, swim, or do anything that may soak the wound until it is healed.  Avoid exercises that make you sweat heavily.  Use anti-itch medicine as directed by your health care provider. The wound may itch when it is healing. Do not pick or scratch at the wound.  Follow up with your health care provider to get your wound rechecked as directed. SEEK MEDICAL CARE  IF:  You have an increase in swelling, pain, or redness around the wound.  You have an increase in the amount of pus coming from the wound.  There is a bad smell coming from the wound.  More of the wound breaks open.  You have a fever. MAKE SURE YOU:   Understand these instructions.  Will watch your condition.  Will get help right away if you are not doing well or get worse. Document Released: 05/13/2003 Document Revised: 08/19/2013 Document Reviewed: 12/18/2010 [facility] Patient Information 2015 [place] Cross[place]ExitCare, [place]LL[place]. This information is not intended to replace advice given to you by your health care provider. Make sure you discuss any questions you have with your health care provider.   Take the antibiotic as instructed.  Wash you wound twice daily with soap and water.  Keep the wound clean and dry.  Stop using rubbing alcohol as this can delay wound healing.  See your doctor or return here for any worsening symptoms.

## 2014-03-18 NOTE — ED Notes (Signed)
Fell Saturday in Vent and cleaned with antibiotic cream, Vaseline and dressing.  Noticed drainage yesterday yellowish in color.  Unsure of last Tetanus shot.

## 2014-03-19 NOTE — ED Provider Notes (Signed)
CSN: [id number]     Arrival date & time 03/18/14  1108 History   First MD Initiated Contact with Patient 03/18/14 1311     Chief Complaint  Patient presents with  . Extremity Laceration     (Consider location/radiation/quality/duration/timing/severity/associated sxs/prior Treatment) The history is provided by the patient.    [name] is a 28 y.o. female presenting with laceration to her right lateral calf which occurred 4 days ago when she tore her skin on an air conditioning vent.  She has been applying antibiotic ointment and has been cleaning it daily with peroxide.  She has developed redness around the site and now notes yellowish drainage and is concerned for possible infection.  She denies fevers, chills or other complaint.  She is not utd with her tetanus.     Past Medical History  Diagnosis Date  . Hypertension   . Asthma    History reviewed. No pertinent past surgical history. History reviewed. No pertinent family history. History  Substance Use Topics  . Smoking status: Former Games developermoker  . Smokeless tobacco: Not on file  . Alcohol Use: No   OB History   Grav Para Term Preterm Abortions TAB SAB Ect Mult Living                 Review of Systems  Constitutional: Negative for fever and chills.  Respiratory: Negative for shortness of breath and wheezing.   Skin: Positive for color change and wound.  Neurological: Negative for numbness.      Allergies  Review of patient's allergies indicates no known allergies.  Home Medications   Prior to Admission medications   Medication Sig Start Date End Date Taking? Authorizing Provider  amLODipine (NORVASC) 5 MG tablet Take 5 mg by mouth daily.   Yes Historical Provider, MD  lisinopril-hydrochlorothiazide (PRINZIDE,ZESTORETIC) 10-12.5 MG per tablet Take 1 tablet by mouth daily.   Yes Historical Provider, MD  sulfamethoxazole-trimethoprim (SEPTRA DS) 800-160 MG per tablet Take 1 tablet by mouth every 12 (twelve)  hours. 03/18/14   Burgess AmorJulie Maryalice Pasley, PA-C   BP 131/85  Pulse 81  Temp(Src) 98.6 F (37 C) (Oral)  Resp 18  Ht 5\' 8"  (1.727 m)  Wt 279 lb (126.554 kg)  BMI 42.43 kg/m2  SpO2 96%  LMP 02/23/2014 Physical Exam  Constitutional: She is oriented to person, place, and time. She appears well-developed and well-nourished.  HENT:  Head: Normocephalic.  Cardiovascular: Normal rate.   Pulmonary/Chest: Effort normal.  Neurological: She is alert and oriented to person, place, and time. No sensory deficit.  Skin: Laceration noted.  6 cm laceration to but not through the subcutaneous layer right lateral calf .  Linear with granulating base.  Edges of wound milldy erythematous,  Yellow crusting/clear fluid dried around wound edges.  No red streaking.    ED Course  Procedures (including critical care time) Labs Review Labs Reviewed  POC URINE PREG, ED    Imaging Review No results found.   EKG Interpretation None      MDM   Final diagnoses:  Wound infection, initial encounter    Old wound not amenable to repair given age.  It is granulating in.  Advised patient that this should continue to heal and sutures at this point will make infection worse, this should heal, but with wider scar than if she had come in the day of the injury.  Advised to stop using peroxide, instead, soap and water wash bid, keep clean, may continue with abx ointment.  Added septra.  Tetanus updated.  Advised f/u with pcp or return here for any worsened sx.    Burgess Amor, PA-C 03/19/14 1424

## 2014-03-19 NOTE — ED Provider Notes (Signed)
Medical screening examination/treatment/procedure(s) were performed by non-physician practitioner and as supervising physician I was immediately available for consultation/collaboration.   EKG Interpretation None        Adilene Areola, MD 03/19/14 1849 

## 2014-04-18 ENCOUNTER — Emergency Department (HOSPITAL_COMMUNITY)
Admission: EM | Admit: 2014-04-18 | Discharge: 2014-04-18 | Disposition: A | Discharge status: Home / Self Care | Payer: Medicaid Other | Attending: Emergency Medicine | Admitting: Emergency Medicine

## 2014-04-18 ENCOUNTER — Emergency Department (HOSPITAL_COMMUNITY): Payer: Medicaid Other

## 2014-04-18 ENCOUNTER — Encounter (HOSPITAL_COMMUNITY): Payer: Self-pay | Admitting: Emergency Medicine

## 2014-04-18 DIAGNOSIS — R1013 Epigastric pain: Secondary | ICD-10-CM | POA: Diagnosis not present

## 2014-04-18 DIAGNOSIS — Z3202 Encounter for pregnancy test, result negative: Secondary | ICD-10-CM | POA: Diagnosis not present

## 2014-04-18 DIAGNOSIS — I10 Essential (primary) hypertension: Secondary | ICD-10-CM | POA: Insufficient documentation

## 2014-04-18 DIAGNOSIS — Z79899 Other long term (current) drug therapy: Secondary | ICD-10-CM | POA: Insufficient documentation

## 2014-04-18 DIAGNOSIS — J45909 Unspecified asthma, uncomplicated: Secondary | ICD-10-CM | POA: Insufficient documentation

## 2014-04-18 DIAGNOSIS — R079 Chest pain, unspecified: Secondary | ICD-10-CM | POA: Diagnosis not present

## 2014-04-18 DIAGNOSIS — E119 Type 2 diabetes mellitus without complications: Secondary | ICD-10-CM | POA: Diagnosis not present

## 2014-04-18 DIAGNOSIS — Z87891 Personal history of nicotine dependence: Secondary | ICD-10-CM | POA: Diagnosis not present

## 2014-04-18 DIAGNOSIS — R109 Unspecified abdominal pain: Secondary | ICD-10-CM

## 2014-04-18 HISTORY — DX: Type 2 diabetes mellitus without complications: E11.9

## 2014-04-18 LAB — COMPREHENSIVE METABOLIC PANEL
ALK PHOS: 96 U/L (ref 39–117)
ALT: 62 U/L — AB (ref 0–35)
AST: 46 U/L — AB (ref 0–37)
Albumin: 3.8 g/dL (ref 3.5–5.2)
Anion gap: 11 (ref 5–15)
BUN: 10 mg/dL (ref 6–23)
CHLORIDE: 101 meq/L (ref 96–112)
CO2: 28 meq/L (ref 19–32)
CREATININE: 0.82 mg/dL (ref 0.50–1.10)
Calcium: 9.2 mg/dL (ref 8.4–10.5)
GFR calc Af Amer: >90 mL/min (ref >90)
GFR calc non Af Amer: >90 mL/min (ref >90)
GLUCOSE: 198 mg/dL — AB (ref 70–99)
Potassium: 3.8 mEq/L (ref 3.7–5.3)
Sodium: 140 mEq/L (ref 137–147)
TOTAL PROTEIN: 6.4 g/dL (ref 6.0–8.3)
Total Bilirubin: 1.1 mg/dL (ref 0.3–1.2)

## 2014-04-18 LAB — URINALYSIS, ROUTINE W REFLEX MICROSCOPIC
Bilirubin Urine: NEGATIVE
GLUCOSE, UA: NEGATIVE mg/dL
HGB URINE DIPSTICK: NEGATIVE
Leukocytes, UA: NEGATIVE
Nitrite: NEGATIVE
PROTEIN: NEGATIVE mg/dL
Specific Gravity, Urine: >1.03 — ABNORMAL HIGH (ref 1.005–1.030)
Urobilinogen, UA: 0.2 mg/dL (ref 0.0–1.0)
pH: 5.5 (ref 5.0–8.0)

## 2014-04-18 LAB — CBC WITH DIFFERENTIAL/PLATELET
BASOS PCT: 0 % (ref 0–1)
Basophils Absolute: 0 10*3/uL (ref 0.0–0.1)
Eosinophils Absolute: 0.1 10*3/uL (ref 0.0–0.7)
Eosinophils Relative: 1 % (ref 0–5)
HEMATOCRIT: 37.6 % (ref 36.0–46.0)
HEMOGLOBIN: 12.6 g/dL (ref 12.0–15.0)
LYMPHS ABS: 2.3 10*3/uL (ref 0.7–4.0)
Lymphocytes Relative: 41 % (ref 12–46)
MCH: 26.7 pg (ref 26.0–34.0)
MCHC: 33.5 g/dL (ref 30.0–36.0)
MCV: 79.7 fL (ref 78.0–100.0)
MONO ABS: 0.3 10*3/uL (ref 0.1–1.0)
MONOS PCT: 5 % (ref 3–12)
NEUTROS ABS: 3.1 10*3/uL (ref 1.7–7.7)
Neutrophils Relative %: 53 % (ref 43–77)
Platelets: 162 10*3/uL (ref 150–400)
RBC: 4.72 MIL/uL (ref 3.87–5.11)
RDW: 13.1 % (ref 11.5–15.5)
WBC: 5.8 10*3/uL (ref 4.0–10.5)

## 2014-04-18 LAB — POC URINE PREG, ED: PREG TEST UR: NEGATIVE

## 2014-04-18 LAB — LIPASE, BLOOD: LIPASE: 14 U/L (ref 11–59)

## 2014-04-18 LAB — D-DIMER, QUANTITATIVE (NOT AT ARMC)

## 2014-04-18 LAB — TROPONIN I: Troponin I: <0.3 ng/mL (ref <0.30)

## 2014-04-18 MED ORDER — IOHEXOL 300 MG/ML  SOLN
50.0000 mL | Freq: Once | INTRAMUSCULAR | Status: AC | PRN
  Administered 2014-04-18: 50 mL via ORAL

## 2014-04-18 MED ORDER — OMEPRAZOLE 20 MG PO CPDR: 20.0000 mg | DELAYED_RELEASE_CAPSULE | Freq: Every day | ORAL | Status: DC

## 2014-04-18 MED ORDER — GI COCKTAIL ~~LOC~~
30.0000 mL | Freq: Once | ORAL | Status: AC
  Administered 2014-04-18: 30 mL via ORAL
  Filled 2014-04-18: qty 30

## 2014-04-18 MED ORDER — IOHEXOL 300 MG/ML  SOLN
100.0000 mL | Freq: Once | INTRAMUSCULAR | Status: AC | PRN
  Administered 2014-04-18: 100 mL via INTRAVENOUS

## 2014-04-18 NOTE — ED Provider Notes (Signed)
CSN: [id number]     Arrival date & time 04/18/14  1428 History   First MD Initiated Contact with Patient 04/18/14 1704    This chart was scribed for No att. providers found by Marica Otter, ED Scribe. This patient was seen in room APOTF/OTF and the patient's care was started at 5:07 PM.  Chief Complaint  Patient presents with  . Abdominal Pain  PCP: Avon Gully, MD The history is provided by the patient. No language interpreter was used.   HPI Comments: Ruth Campbell is a 28 y.o. female, with medical Hx noted below and significant for DM and high blood pressure, who presents to the Emergency Department complaining of constant chest pain with associated epigastric pain, constipation, mid back pain and blurred vision onset yesterday. Pt notes the blurred vision started first with rest of her Sx following. Pt states her chest pain is made worse with deep breaths; and states nothing makes it better. Specifically, pt notes taking Advil yesterday without relief. Pt denies using glasses or contacts. Pt denies any Hx of similar pain, except pt notes that she has intermittent back pain at baseline. Pt further denies nausea, vomiting, diarrhea, fever, or chills. Pt further denies any Hx of abd problems or acid reflux.   Medical Hx (DM): Pt notes her glucose levels have been inconsistent and in the past 2 weeks it was in the 200s, however, recently went back up. Pt reports being on Metformin only for her DM.   Last Menstrual Cycle: Pt states her last menstrual period was on 03/16/14.  Past Medical History  Diagnosis Date  . Hypertension   . Asthma   . Diabetes mellitus without complication    History reviewed. No pertinent past surgical history. No family history on file. History  Substance Use Topics  . Smoking status: Former Games developer  . Smokeless tobacco: Not on file  . Alcohol Use: No   OB History   Grav Para Term Preterm Abortions TAB SAB Ect Mult Living                 Review of  Systems  A complete 10 system review of systems was obtained and all systems are negative except as noted in the HPI and PMH.    Allergies  Review of patient's allergies indicates no known allergies.  Home Medications   Prior to Admission medications   Medication Sig Start Date End Date Taking? Authorizing Provider  amLODipine (NORVASC) 5 MG tablet Take 5 mg by mouth daily.   Yes Historical Provider, MD  ibuprofen (ADVIL,MOTRIN) 200 MG tablet Take 400 mg by mouth every 6 (six) hours as needed.   Yes Historical Provider, MD  linagliptin (TRADJENTA) 5 MG TABS tablet Take 5 mg by mouth daily.   Yes Historical Provider, MD  lisinopril-hydrochlorothiazide (PRINZIDE,ZESTORETIC) 10-12.5 MG per tablet Take 1 tablet by mouth daily.   Yes Historical Provider, MD  metFORMIN (GLUCOPHAGE) 500 MG tablet Take by mouth 2 (two) times daily with a meal.   Yes Historical Provider, MD  sulfamethoxazole-trimethoprim (SEPTRA DS) 800-160 MG per tablet Take 1 tablet by mouth every 12 (twelve) hours. 03/18/14  Yes Burgess Amor, PA-C  omeprazole (PRILOSEC) 20 MG capsule Take 1 capsule (20 mg total) by mouth daily. 04/18/14   Glynn Octave, MD   Triage Vitals: BP 124/68  Pulse 91  Temp(Src) 98.6 F (37 C) (Oral)  Resp 18  Ht 5\' 9"  (1.753 m)  Wt 300 lb (136.079 kg)  BMI 44.28 kg/m2  SpO2 100%  LMP 03/16/2014 Physical Exam  Nursing note and vitals reviewed. Constitutional: She is oriented to person, place, and time. She appears well-developed and well-nourished. No distress.  HENT:  Head: Normocephalic and atraumatic.  Mouth/Throat: Oropharynx is clear and moist. No oropharyngeal exudate.  Eyes: Conjunctivae and EOM are normal. Pupils are equal, round, and reactive to light.  Neck: Normal range of motion. Neck supple.  No meningismus.  Cardiovascular: Normal rate, regular rhythm, normal heart sounds and intact distal pulses.   No murmur heard. Pulmonary/Chest: Effort normal and breath sounds normal. No  respiratory distress.  Abdominal: Soft. There is tenderness (mild epigastric tenderness, no cva tenderness, no RUQ tenderness, negative murphy's sign). There is no rebound and no guarding.  Musculoskeletal: Normal range of motion. She exhibits no edema and no tenderness.  Neurological: She is alert and oriented to person, place, and time. No cranial nerve deficit. She exhibits normal muscle tone. Coordination normal.  No ataxia on finger to nose bilaterally. No pronator drift. 5/5 strength throughout. CN 2-12 intact. Negative Romberg. Equal grip strength. Sensation intact. Gait is normal.   Skin: Skin is warm.  Psychiatric: She has a normal mood and affect. Her behavior is normal.    ED Course  Procedures (including critical care time) DIAGNOSTIC STUDIES: Oxygen Saturation is 100% on RA, nl by my interpretation.    COORDINATION OF CARE: 5:15 PM-Discussed treatment plan which includes UA, labs, EKG and visual acuity screening with pt at bedside and pt agreed to plan.   Labs Review Labs Reviewed  URINALYSIS, ROUTINE W REFLEX MICROSCOPIC - Abnormal; Notable for the following:    Color, Urine ORANGE (*)    APPearance HAZY (*)    Specific Gravity, Urine >1.030 (*)    Ketones, ur TRACE (*)    All other components within normal limits  COMPREHENSIVE METABOLIC PANEL - Abnormal; Notable for the following:    Glucose, Bld 198 (*)    AST 46 (*)    ALT 62 (*)    All other components within normal limits  CBC WITH DIFFERENTIAL  TROPONIN I  D-DIMER, QUANTITATIVE  LIPASE, BLOOD  URINE MICROSCOPIC-ADD ON  POC URINE PREG, ED    Imaging Review Dg Chest 2 View  04/18/2014   CLINICAL DATA:  28 year old female with abdomen and back pain. Initial encounter.  EXAM: CHEST  2 VIEW  COMPARISON:  [facility] chest radiographs 08/03/2013.  FINDINGS: Stable and normal lung volumes. Large body habitus. Normal cardiac size and mediastinal contours. Visualized tracheal air column is within  normal limits. Lungs are stable and clear. No pneumothorax or effusion. No osseous abnormality identified. No pneumoperitoneum identified. Negative visible bowel gas pattern.  IMPRESSION: Negative, no acute cardiopulmonary abnormality.   Electronically Signed   By: Augusto Gamble M.D.   On: 04/18/2014 18:22   Ct Abdomen Pelvis W Contrast  04/18/2014   CLINICAL DATA:  28 year old female with epigastric and abdominal pain. Initial encounter.  EXAM: CT ABDOMEN AND PELVIS WITH CONTRAST  TECHNIQUE: Multidetector CT imaging of the abdomen and pelvis was performed using the standard protocol following bolus administration of intravenous contrast.  CONTRAST:  OMNIPAQUE IOHEXOL 300 MG/ML SOLN, 50mL OMNIPAQUE IOHEXOL 300 MG/ML SOLN  COMPARISON:  Chest radiographs from the same day reported separately.  FINDINGS: Negative lung bases.  No pericardial or pleural effusion.  Degenerative endplate changes in the lower lumbar spine. No acute osseous abnormality identified.  No pelvic free fluid. Negative uterus and adnexa. Negative distal colon except for mild redundancy.  Unremarkable bladder.  Left: And transverse colon are negative except for some redundancy and retained stool. Negative right colon. The appendix is not delineated, but there is no pericecal inflammation. The terminal ileum appears normal. Oral contrast has not yet reached the distal small bowel. No dilated or abnormal small bowel loops identified. Negative stomach and duodenum.  Moderate to severe low density throughout the liver in keeping with steatosis.  Gallbladder, spleen, pancreas, adrenal glands, and portal venous system are within normal limits. Major arterial structures in the abdomen and pelvis appear normal. No free fluid. Negative kidneys. No hydroureter. No lymphadenopathy.  IMPRESSION: 1. Moderate to severe hepatic steatosis. Given the patient's young age, hepatology follow-up recommended. 2. Otherwise negative CT abdomen and pelvis.    Electronically Signed   By: Augusto GambleLee  Hall M.D.   On: 04/18/2014 20:47     EKG Interpretation   Date/Time:  Saturday April 18 2014 17:30:33 EDT Ventricular Rate:  81 PR Interval:  128 QRS Duration: 85 QT Interval:  387 QTC Calculation: 449 R Axis:   46 Text Interpretation:  Sinus rhythm No previous ECGs available Confirmed by  Jung Ingerson  MD, Kenneth Cuaresma [phone]([id number]) on 04/18/2014 5:52:10 PM      MDM   Final diagnoses:  Abdominal pain, unspecified abdominal location   Upper abdominal soreness ongoing since yesterday that is constant. No nausea, vomiting or diarrhea. Denies any chest pain. Endorses blurry vision for the past 2 days with recent diagnosis of diabetes last week. No fever. Pain is constant, worse with movement, and atypical for ACS. EKG normal sinus rhythm. Labs remarkable for hyperglycemia without DKA. Chest x-ray negative Troponin and d-dimer negative.  No gallstones seen on CT. Fatty liver.  Will refer to GI.  Patient agreeable to US of gallbladder tomorrow. Tolerating PO in the ED.  No vomiting.  Return precautions discussed.   BP 115/83  Pulse 82  Temp(Src) 98.6 F (37 C) (Oral)  Resp 20  Ht 5\' 9"  (1.753 m)  Wt 300 lb (136.079 kg)  BMI 44.28 kg/m2  SpO2 98%  LMP 03/16/2014   I personally performed the services described in this documentation, which was scribed in my presence. The recorded information has been reviewed and is accurate.   Glynn OctaveStephen Taralynn Quiett, MD 04/19/14 [phone]

## 2014-04-18 NOTE — Discharge Instructions (Signed)
Abdominal Pain Take the stomach medication as prescribed. Follow up with your doctor. Return to tomorrow for an ultrasound of your gallbladder. Return to the ED if you develop new or worsening symptoms. Many things can cause abdominal pain. Usually, abdominal pain is not caused by a disease and will improve without treatment. It can often be observed and treated at home. Your health care provider will do a physical exam and possibly order blood tests and X-rays to help determine the seriousness of your pain. However, in many cases, more time must pass before a clear cause of the pain can be found. Before that point, your health care provider may not know if you need more testing or further treatment. HOME CARE INSTRUCTIONS  Monitor your abdominal pain for any changes. The following actions may help to alleviate any discomfort you are experiencing:  Only take over-the-counter or prescription medicines as directed by your health care provider.  Do not take laxatives unless directed to do so by your health care provider.  Try a clear liquid diet (broth, tea, or water) as directed by your health care provider. Slowly move to a bland diet as tolerated. SEEK MEDICAL CARE IF:  You have unexplained abdominal pain.  You have abdominal pain associated with nausea or diarrhea.  You have pain when you urinate or have a bowel movement.  You experience abdominal pain that wakes you in the night.  You have abdominal pain that is worsened or improved by eating food.  You have abdominal pain that is worsened with eating fatty foods.  You have a fever. SEEK IMMEDIATE MEDICAL CARE IF:   Your pain does not go away within 2 hours.  You keep throwing up (vomiting).  Your pain is felt only in portions of the abdomen, such as the right side or the left lower portion of the abdomen.  You pass bloody or black tarry stools. MAKE SURE YOU:  Understand these instructions.   Will watch your condition.    Will get help right away if you are not doing well or get worse.  Document Released: 05/24/2005 Document Revised: 08/19/2013 Document Reviewed: 04/23/2013 [facility]Exit[facility] Patient Information 2015 [place], [place]LL[place]. This information is not intended to replace advice given to you by your health care provider. Make sure you discuss any questions you have with your health care provider.

## 2014-04-18 NOTE — ED Notes (Signed)
Pt given cranberry juice.

## 2014-04-18 NOTE — ED Notes (Signed)
Pt c/o chest, epigastric pain, blurry vision that started yesterday, pain is made worse with certain movements, denies any n/v/d.

## 2014-04-19 ENCOUNTER — Ambulatory Visit (HOSPITAL_COMMUNITY)
Admit: 2014-04-19 | Discharge: 2014-04-19 | Disposition: A | Discharge status: Home / Self Care | Payer: Medicaid Other | Source: Ambulatory Visit | Attending: Emergency Medicine | Admitting: Emergency Medicine

## 2014-04-19 DIAGNOSIS — K7689 Other specified diseases of liver: Secondary | ICD-10-CM | POA: Diagnosis not present

## 2014-04-19 DIAGNOSIS — R160 Hepatomegaly, not elsewhere classified: Secondary | ICD-10-CM | POA: Insufficient documentation

## 2014-04-19 DIAGNOSIS — R1011 Right upper quadrant pain: Secondary | ICD-10-CM | POA: Insufficient documentation

## 2014-12-23 ENCOUNTER — Emergency Department (HOSPITAL_COMMUNITY)
Admission: EM | Admit: 2014-12-23 | Discharge: 2014-12-23 | Disposition: A | Discharge status: Home / Self Care | Payer: Medicaid Other | Attending: Emergency Medicine | Admitting: Emergency Medicine

## 2014-12-23 ENCOUNTER — Emergency Department (HOSPITAL_COMMUNITY): Payer: Medicaid Other

## 2014-12-23 ENCOUNTER — Encounter (HOSPITAL_COMMUNITY): Payer: Self-pay | Admitting: Emergency Medicine

## 2014-12-23 DIAGNOSIS — Y9289 Other specified places as the place of occurrence of the external cause: Secondary | ICD-10-CM | POA: Insufficient documentation

## 2014-12-23 DIAGNOSIS — S90111A Contusion of right great toe without damage to nail, initial encounter: Secondary | ICD-10-CM | POA: Insufficient documentation

## 2014-12-23 DIAGNOSIS — J45909 Unspecified asthma, uncomplicated: Secondary | ICD-10-CM | POA: Diagnosis not present

## 2014-12-23 DIAGNOSIS — Z792 Long term (current) use of antibiotics: Secondary | ICD-10-CM | POA: Diagnosis not present

## 2014-12-23 DIAGNOSIS — Z87891 Personal history of nicotine dependence: Secondary | ICD-10-CM | POA: Diagnosis not present

## 2014-12-23 DIAGNOSIS — Y998 Other external cause status: Secondary | ICD-10-CM | POA: Insufficient documentation

## 2014-12-23 DIAGNOSIS — Z79899 Other long term (current) drug therapy: Secondary | ICD-10-CM | POA: Diagnosis not present

## 2014-12-23 DIAGNOSIS — E119 Type 2 diabetes mellitus without complications: Secondary | ICD-10-CM | POA: Diagnosis not present

## 2014-12-23 DIAGNOSIS — Y9301 Activity, walking, marching and hiking: Secondary | ICD-10-CM | POA: Insufficient documentation

## 2014-12-23 DIAGNOSIS — S90211A Contusion of right great toe with damage to nail, initial encounter: Secondary | ICD-10-CM

## 2014-12-23 DIAGNOSIS — I10 Essential (primary) hypertension: Secondary | ICD-10-CM | POA: Diagnosis not present

## 2014-12-23 DIAGNOSIS — W228XXA Striking against or struck by other objects, initial encounter: Secondary | ICD-10-CM | POA: Insufficient documentation

## 2014-12-23 DIAGNOSIS — S99921A Unspecified injury of right foot, initial encounter: Secondary | ICD-10-CM | POA: Diagnosis present

## 2014-12-23 DIAGNOSIS — S90411A Abrasion, right great toe, initial encounter: Secondary | ICD-10-CM | POA: Diagnosis not present

## 2014-12-23 NOTE — ED Notes (Signed)
Pt reports hit her right great toe on a front porch while walking. Pt reports right toe pain ever since. nad noted.

## 2014-12-23 NOTE — ED Provider Notes (Signed)
CSN: [id number]     Arrival date & time 12/23/14  1734 History   First MD Initiated Contact with Patient 12/23/14 1739     Chief Complaint  Patient presents with  . Toe Pain     (Consider location/radiation/quality/duration/timing/severity/associated sxs/prior Treatment) Patient is a 29 y.o. female presenting with toe pain. The history is provided by the patient.  Toe Pain This is a new problem. The current episode started today. The problem occurs constantly. The problem has been unchanged.   [place] Rubye OaksDickerson is a 29 y.o. female who presents to the ED with pain to the right great toe. She was walking on her front porch and hit her toe causing the toenail to break. She denies any other injuries.  Past Medical History  Diagnosis Date  . Hypertension   . Asthma   . Diabetes mellitus without complication    History reviewed. No pertinent past surgical history. History reviewed. No pertinent family history. History  Substance Use Topics  . Smoking status: Former Games developermoker  . Smokeless tobacco: Not on file  . Alcohol Use: No   OB History    No data available     Review of Systems Negative except as sated in HPI   Allergies  Review of patient's allergies indicates no known allergies.  Home Medications   Prior to Admission medications   Medication Sig Start Date End Date Taking? Authorizing Provider  amLODipine (NORVASC) 5 MG tablet Take 5 mg by mouth daily.    Historical Provider, MD  ibuprofen (ADVIL,MOTRIN) 200 MG tablet Take 400 mg by mouth every 6 (six) hours as needed.    Historical Provider, MD  linagliptin (TRADJENTA) 5 MG TABS tablet Take 5 mg by mouth daily.    Historical Provider, MD  lisinopril-hydrochlorothiazide (PRINZIDE,ZESTORETIC) 10-12.5 MG per tablet Take 1 tablet by mouth daily.    Historical Provider, MD  metFORMIN (GLUCOPHAGE) 500 MG tablet Take by mouth 2 (two) times daily with a meal.    Historical Provider, MD  omeprazole (PRILOSEC) 20 MG capsule Take 1  capsule (20 mg total) by mouth daily. 04/18/14   Glynn OctaveStephen Rancour, MD  sulfamethoxazole-trimethoprim (SEPTRA DS) 800-160 MG per tablet Take 1 tablet by mouth every 12 (twelve) hours. 03/18/14   Burgess AmorJulie Idol, PA-C   BP 105/80 mmHg  Pulse 86  Temp(Src) 98.1 F (36.7 C) (Oral)  Resp 18  Ht 5\' 9"  (1.753 m)  Wt 250 lb (113.399 kg)  BMI 36.90 kg/m2  SpO2 100%  LMP 12/15/2014 Physical Exam  Constitutional: She is oriented to person, place, and time. She appears well-developed and well-nourished.  HENT:  Head: Normocephalic.  Eyes: EOM are normal.  Neck: Neck supple.  Cardiovascular: Normal rate.   Pulmonary/Chest: Effort normal.  Musculoskeletal: Normal range of motion.       Right foot: There is tenderness and swelling.       Feet:  Pedal pulses 2+ bilateral, right great toe with swelling and small area of bleeding to the tip at the nail. The nail is broken at the distal portion. There is no subungual hematoma noted at this time.   Neurological: She is alert and oriented to person, place, and time. No cranial nerve deficit.  Skin: Skin is warm and dry.  Psychiatric: She has a normal mood and affect. Her behavior is normal.  Nursing note and vitals reviewed.   ED Course  Procedures (including critical care time) Wound care, buddy tape, post op shoe, ice, elevation  Labs Review Labs Reviewed -  No data to display  Imaging Review Dg Toe Great Right  12/23/2014   CLINICAL DATA:  Pt hit Rt great toe while walking barefoot on her porch last night. Pt tore part of nail from Rt great toe off. Pt c/o pain in Rt great toe and difficulty applying pressure to Rt foot. Hx diabetes  EXAM: RIGHT GREAT TOE  COMPARISON:  None.  FINDINGS: No fracture. Joints are normally spaced and aligned. Small metal foreign body noted in the plantar soft tissues at the base of the second toe likely a piece of needle. This is most likely chronic.  IMPRESSION: No fracture or dislocation.   Electronically Signed   By:  Amie Portland M.D.   On: 12/23/2014 18:01     MDM  29 y.o. female with contusion and abrasion to the right great toe. Stable for d/c without neurovascular compromise. Discussed with the patient clinical and x-ray findings and plan of care. She is a diabetic and will observe closely for any signs of infection. She will continue to wash with warm water and antibacterial soap. She will continue to apply neosporin ointment to the wound. All questioned fully answered. She will return if any problems arise.   Final diagnoses:  Contusion of right great toe with damage to nail, initial encounter       [facility], NP 12/23/14 2345  Blane Ohara, MD 12/23/14 2355

## 2014-12-23 NOTE — Discharge Instructions (Signed)
Soak your foot in warm water with antibacterial soap a couple times a day for about 15 minutes. Use the antibacterial ointment. Take tylenol and ibuprofen as needed for pain. Follow up if you develop any signs of infection.

## 2015-04-14 ENCOUNTER — Emergency Department (HOSPITAL_COMMUNITY)
Admission: EM | Admit: 2015-04-14 | Discharge: 2015-04-14 | Disposition: A | Discharge status: Home / Self Care | Payer: Medicaid Other | Attending: Emergency Medicine | Admitting: Emergency Medicine

## 2015-04-14 ENCOUNTER — Encounter (HOSPITAL_COMMUNITY): Payer: Self-pay | Admitting: Emergency Medicine

## 2015-04-14 DIAGNOSIS — M5442 Lumbago with sciatica, left side: Secondary | ICD-10-CM | POA: Diagnosis not present

## 2015-04-14 DIAGNOSIS — Z87891 Personal history of nicotine dependence: Secondary | ICD-10-CM | POA: Insufficient documentation

## 2015-04-14 DIAGNOSIS — I10 Essential (primary) hypertension: Secondary | ICD-10-CM | POA: Diagnosis not present

## 2015-04-14 DIAGNOSIS — M545 Low back pain: Secondary | ICD-10-CM | POA: Diagnosis present

## 2015-04-14 DIAGNOSIS — E119 Type 2 diabetes mellitus without complications: Secondary | ICD-10-CM | POA: Diagnosis not present

## 2015-04-14 DIAGNOSIS — J45909 Unspecified asthma, uncomplicated: Secondary | ICD-10-CM | POA: Diagnosis not present

## 2015-04-14 DIAGNOSIS — Z79899 Other long term (current) drug therapy: Secondary | ICD-10-CM | POA: Insufficient documentation

## 2015-04-14 MED ORDER — DIAZEPAM 5 MG PO TABS
5.0000 mg | ORAL_TABLET | Freq: Once | ORAL | Status: AC
  Administered 2015-04-14: 5 mg via ORAL
  Filled 2015-04-14: qty 1

## 2015-04-14 MED ORDER — KETOROLAC TROMETHAMINE 60 MG/2ML IM SOLN
60.0000 mg | Freq: Once | INTRAMUSCULAR | Status: AC
  Administered 2015-04-14: 60 mg via INTRAMUSCULAR
  Filled 2015-04-14: qty 2

## 2015-04-14 NOTE — ED Notes (Signed)
Pt made aware to return if symptoms worsen or if any life threatening symptoms occur.   

## 2015-04-14 NOTE — ED Provider Notes (Signed)
CSN: [id number]     Arrival date & time 04/14/15  1248 History   First MD Initiated Contact with Patient 04/14/15 1306     Chief Complaint  Patient presents with  . Back Pain     (Consider location/radiation/quality/duration/timing/severity/associated sxs/prior Treatment) Patient is a 29 y.o. female presenting with back pain. The history is provided by the patient.  Back Pain Location:  Lumbar spine Quality:  Aching and burning Radiates to:  Does not radiate Pain severity:  Moderate Pain is:  Same all the time Onset quality:  Gradual Duration:  2 months Timing:  Constant Progression:  Worsening Chronicity:  New Context: not jumping from heights, not lifting heavy objects, not pedestrian accident, not recent injury and not twisting   Relieved by:  Nothing Worsened by:  Movement, palpation, touching and twisting Ineffective treatments:  None tried Associated symptoms: leg pain   Associated symptoms: no bladder incontinence, no bowel incontinence, no chest pain, no dysuria, no fever, no headaches, no numbness, no paresthesias, no pelvic pain and no perianal numbness    29 yo F with a chief complaint of low back pain. This been going on for at least couple months. Asian unsure of initial injury. Has been seen by a outside hospital and prescribed hydrocodone with some relief. Patient states that is continuing to hurt her and so she sought medical care. Denies recent injury fall. Denies loss of bowel or bladder denies loss of perirectal sensation. Was taking NSAIDs initially but then stopped.  Past Medical History  Diagnosis Date  . Hypertension   . Asthma   . Diabetes mellitus without complication    History reviewed. No pertinent past surgical history. History reviewed. No pertinent family history. Social History  Substance Use Topics  . Smoking status: Former Games developer  . Smokeless tobacco: None  . Alcohol Use: No     Comment: occasional    OB History    No data available      Review of Systems  Constitutional: Negative for fever and chills.  HENT: Negative for congestion and rhinorrhea.   Eyes: Negative for redness and visual disturbance.  Respiratory: Negative for shortness of breath and wheezing.   Cardiovascular: Negative for chest pain and palpitations.  Gastrointestinal: Negative for nausea, vomiting and bowel incontinence.  Genitourinary: Negative for bladder incontinence, dysuria, urgency and pelvic pain.  Musculoskeletal: Positive for myalgias, back pain and arthralgias.  Skin: Negative for pallor and wound.  Neurological: Negative for dizziness, numbness, headaches and paresthesias.      Allergies  Review of patient's allergies indicates no known allergies.  Home Medications   Prior to Admission medications   Medication Sig Start Date End Date Taking? Authorizing Provider  amLODipine (NORVASC) 5 MG tablet Take 5 mg by mouth daily.   Yes Historical Provider, MD  linagliptin (TRADJENTA) 5 MG TABS tablet Take 5 mg by mouth daily.   Yes Historical Provider, MD  lisinopril-hydrochlorothiazide (PRINZIDE,ZESTORETIC) 10-12.5 MG per tablet Take 1 tablet by mouth daily.   Yes Historical Provider, MD  metFORMIN (GLUCOPHAGE) 500 MG tablet Take by mouth 2 (two) times daily with a meal.   Yes Historical Provider, MD  ibuprofen (ADVIL,MOTRIN) 200 MG tablet Take 400 mg by mouth every 6 (six) hours as needed.    Historical Provider, MD  omeprazole (PRILOSEC) 20 MG capsule Take 1 capsule (20 mg total) by mouth daily. 04/18/14   Glynn Octave, MD  sulfamethoxazole-trimethoprim (SEPTRA DS) 800-160 MG per tablet Take 1 tablet by mouth every 12 (twelve)  hours. 03/18/14   Burgess Amor, PA-C   BP 135/70 mmHg  Pulse 86  Temp(Src) 98 F (36.7 C) (Oral)  Resp 15  Ht  (1.702 m)  Wt 315 lb (142.883 kg)  BMI 49.32 kg/m2  SpO2 100%  LMP 03/31/2015 Physical Exam  Constitutional: She is oriented to person, place, and time. She appears well-developed and  well-nourished. No distress.  Awake, alert, nontoxic appearance with baseline speech.  HENT:  Head: Normocephalic and atraumatic.  Eyes: EOM are normal. Pupils are equal, round, and reactive to light. Right eye exhibits no discharge. Left eye exhibits no discharge.  Neck: Normal range of motion. Neck supple.  Cardiovascular: Normal rate and regular rhythm.  Exam reveals no gallop and no friction rub.   No murmur heard. Pulmonary/Chest: Effort normal and breath sounds normal. No respiratory distress. She has no wheezes. She has no rales. She exhibits no tenderness.  Abdominal: Soft. Bowel sounds are normal. She exhibits no distension and no mass. There is no tenderness. There is no rebound.  Musculoskeletal: She exhibits no edema or tenderness.       Thoracic back: She exhibits no tenderness.       Lumbar back: She exhibits no tenderness.  Bilateral lower extremities non tender without new rashes or color change, baseline ROM with intact DP / PT pulses, CR<2 secs all digits bilaterally, sensation baseline light touch bilaterally for pt, DTR's symmetric and intact bilaterally KJ / AJ, motor symmetric bilateral 5 / 5 hip flexion, quadriceps, hamstrings, EHL, foot dorsiflexion, foot plantarflexion, gait somewhat antalgic but without apparent new ataxia.  Neurological: She is alert and oriented to person, place, and time.  Mental status baseline for patient.  Upper extremity motor strength and sensation intact and symmetric bilaterally.  Skin: Skin is warm and dry. No rash noted. She is not diaphoretic.  Psychiatric: She has a normal mood and affect. Her behavior is normal.  Nursing note and vitals reviewed.   ED Course  Procedures (including critical care time) Labs Review Labs Reviewed - No data to display  Imaging Review No results found. I have personally reviewed and evaluated these images and lab results as part of my medical decision-making.   EKG Interpretation None      MDM    Final diagnoses:  Left-sided low back pain with left-sided sciatica    29 yo F with low back pain. Patient able to ambulate without difficulty. No noted loss of sensation bilaterally. Mild pain to the lower L-spine. No recent injury feel imaging would be unhelpful. Patient denies chance of pregnancy. Will treat with Toradol and Valium, have her start NSAIDs follow up with her PCP.    Melene Plan, DO 04/14/15 1324

## 2015-04-14 NOTE — Discharge Instructions (Signed)
°  Take 4 over the counter ibuprofen tablets 3 times a day or 2 over-the-counter naproxen tablets twice a day for pain.  Back Exercises Back exercises help treat and prevent back injuries. The goal is to increase your strength in your belly (abdominal) and back muscles. These exercises can also help with flexibility. Start these exercises when told by your doctor. HOME CARE Back exercises include: Pelvic Tilt.  Lie on your back with your knees bent. Tilt your pelvis until the lower part of your back is against the floor. Hold this position 5 to 10 sec. Repeat this exercise 5 to 10 times. Knee to Chest.  Pull 1 knee up against your chest and hold for 20 to 30 seconds. Repeat this with the other knee. This may be done with the other leg straight or bent, whichever feels better. Then, pull both knees up against your chest. Sit-Ups or Curl-Ups.  Bend your knees 90 degrees. Start with tilting your pelvis, and do a partial, slow sit-up. Only lift your upper half 30 to 45 degrees off the floor. Take at least 2 to 3 seonds for each sit-up. Do not do sit-ups with your knees out straight. If partial sit-ups are difficult, simply do the above but with only tightening your belly (abdominal) muscles and holding it as told. Hip-Lift.  Lie on your back with your knees flexed 90 degrees. Push down with your feet and shoulders as you raise your hips 2 inches off the floor. Hold for 10 seconds, repeat 5 to 10 times. Back Arches.  Lie on your stomach. Prop yourself up on bent elbows. Slowly press on your hands, causing an arch in your low back. Repeat 3 to 5 times. Shoulder-Lifts.  Lie face down with arms beside your body. Keep hips and belly pressed to floor as you slowly lift your head and shoulders off the floor. Do not overdo your exercises. Be careful in the beginning. Exercises may cause you some mild back discomfort. If the pain lasts for more than 15 minutes, stop the exercises until you see your doctor.  Improvement with exercise for back problems is slow.  Document Released: 09/16/2010 Document Revised: 11/06/2011 Document Reviewed: 06/15/2011 [facility] [employer] [facility] Patient Information 2015 [place], [place]. This information is not intended to replace advice given to you by your health care provider. Make sure you discuss any questions you have with your health care provider.

## 2015-04-14 NOTE — ED Notes (Signed)
Pt reports bilateral lower back pain radiating to sides and down bilateral legs x3-4 days.  Pt reports being seen at [facility] and was given pain medication to go home with. Pt denies urinary symptoms or abdominal.  Pt alert and oriented.

## 2015-08-26 ENCOUNTER — Emergency Department (HOSPITAL_COMMUNITY)
Admission: EM | Admit: 2015-08-26 | Discharge: 2015-08-26 | Disposition: A | Discharge status: Home / Self Care | Payer: Medicaid Other | Attending: Emergency Medicine | Admitting: Emergency Medicine

## 2015-08-26 ENCOUNTER — Encounter (HOSPITAL_COMMUNITY): Payer: Self-pay | Admitting: Emergency Medicine

## 2015-08-26 DIAGNOSIS — I10 Essential (primary) hypertension: Secondary | ICD-10-CM | POA: Insufficient documentation

## 2015-08-26 DIAGNOSIS — J45909 Unspecified asthma, uncomplicated: Secondary | ICD-10-CM | POA: Insufficient documentation

## 2015-08-26 DIAGNOSIS — L0390 Cellulitis, unspecified: Secondary | ICD-10-CM

## 2015-08-26 DIAGNOSIS — R42 Dizziness and giddiness: Secondary | ICD-10-CM | POA: Diagnosis not present

## 2015-08-26 DIAGNOSIS — L0231 Cutaneous abscess of buttock: Secondary | ICD-10-CM | POA: Diagnosis not present

## 2015-08-26 DIAGNOSIS — L03317 Cellulitis of buttock: Secondary | ICD-10-CM | POA: Diagnosis not present

## 2015-08-26 DIAGNOSIS — Z7984 Long term (current) use of oral hypoglycemic drugs: Secondary | ICD-10-CM | POA: Diagnosis not present

## 2015-08-26 DIAGNOSIS — Z79899 Other long term (current) drug therapy: Secondary | ICD-10-CM | POA: Insufficient documentation

## 2015-08-26 DIAGNOSIS — Z87891 Personal history of nicotine dependence: Secondary | ICD-10-CM | POA: Insufficient documentation

## 2015-08-26 DIAGNOSIS — E119 Type 2 diabetes mellitus without complications: Secondary | ICD-10-CM | POA: Insufficient documentation

## 2015-08-26 DIAGNOSIS — L0291 Cutaneous abscess, unspecified: Secondary | ICD-10-CM

## 2015-08-26 MED ORDER — CEPHALEXIN 500 MG PO CAPS
500.0000 mg | ORAL_CAPSULE | Freq: Once | ORAL | Status: AC
  Administered 2015-08-26: 500 mg via ORAL
  Filled 2015-08-26: qty 1

## 2015-08-26 MED ORDER — HYDROCODONE-ACETAMINOPHEN 5-325 MG PO TABS: 1.0000 | ORAL_TABLET | Freq: Four times a day (QID) | ORAL | Status: DC | PRN

## 2015-08-26 MED ORDER — CEPHALEXIN 500 MG PO CAPS: 500.0000 mg | ORAL_CAPSULE | Freq: Three times a day (TID) | ORAL | Status: DC

## 2015-08-26 MED ORDER — LIDOCAINE-EPINEPHRINE (PF) 1 %-1:200000 IJ SOLN
10.0000 mL | Freq: Once | INTRAMUSCULAR | Status: AC
  Administered 2015-08-26: 10 mL via INTRADERMAL
  Filled 2015-08-26: qty 10

## 2015-08-26 NOTE — ED Notes (Signed)
PT c/o redness, swelling and pain with no drainage to inner right buttocks x1 week.

## 2015-08-26 NOTE — ED Notes (Signed)
Pt made aware to return if symptoms worsen or if any life threatening symptoms occur.   

## 2015-08-26 NOTE — ED Provider Notes (Signed)
CSN: [id number]     Arrival date & time 08/26/15  8295 History  By signing my name below, I, Ronney Lion, attest that this documentation has been prepared under the direction and in the presence of Marily Memos, MD. Electronically Signed: Ronney Lion, ED Scribe. 08/26/2015. 7:49 PM.    Chief Complaint  Patient presents with  . Abscess   Patient is a 29 y.o. female presenting with abscess. The history is provided by the patient. No language interpreter was used.  Abscess Location:  Ano-genital Ano-genital abscess location:  R buttock Abscess quality: painful and redness   Duration:  1 week Progression:  Worsening Pain details:    Quality:  Aching   Severity:  Severe   Duration:  1 week   Timing:  Constant   Progression:  Worsening Chronicity:  Recurrent Relieved by:  None tried Worsened by:  Nothing tried Ineffective treatments:  None tried Risk factors: prior abscess     HPI Comments: Ruth Campbell is a 29 y.o. female who presents to the Emergency Department complaining of a gradual-onset, constant, worsening localized area of pain, swelling, and redness on her right buttock that began 1 week ago. She states last night she felt dizzy and tired. She reports a history of the same and was diagnosed with an abscess in a different area on her side several years ago. Patient has NKDA.   Past Medical History  Diagnosis Date  . Hypertension   . Asthma   . Diabetes mellitus without complication (HCC)    History reviewed. No pertinent past surgical history. History reviewed. No pertinent family history. Social History  Substance Use Topics  . Smoking status: Former Games developer  . Smokeless tobacco: None  . Alcohol Use: No     Comment: occasional    OB History    No data available     Review of Systems  Skin: Positive for color change (localized area of pain, redness, and swelling).  Neurological: Positive for dizziness.  All other systems reviewed and are negative.  Allergies   Review of patient's allergies indicates no known allergies.  Home Medications   Prior to Admission medications   Medication Sig Start Date End Date Taking? Authorizing Provider  amLODipine (NORVASC) 5 MG tablet Take 5 mg by mouth daily.   Yes Historical Provider, MD  linagliptin (TRADJENTA) 5 MG TABS tablet Take 5 mg by mouth daily.   Yes Historical Provider, MD  lisinopril-hydrochlorothiazide (PRINZIDE,ZESTORETIC) 10-12.5 MG per tablet Take 1 tablet by mouth daily.   Yes Historical Provider, MD  metFORMIN (GLUCOPHAGE) 500 MG tablet Take by mouth 2 (two) times daily with a meal.   Yes Historical Provider, MD  cephALEXin (KEFLEX) 500 MG capsule Take 1 capsule (500 mg total) by mouth 3 (three) times daily. 08/26/15   Marily Memos, MD  HYDROcodone-acetaminophen (NORCO) 5-325 MG tablet Take 1 tablet by mouth every 6 (six) hours as needed. 08/26/15   Barbara Cower Yanel Dombrosky, MD   BP 122/56 mmHg  Pulse 97  Temp(Src) 98.2 F (36.8 C) (Oral)  Resp 16  Ht  (1.753 m)  Wt 300 lb (136.079 kg)  BMI 44.28 kg/m2  SpO2 99%  LMP 08/18/2015 Physical Exam  Constitutional: She is oriented to person, place, and time. She appears well-developed and well-nourished. No distress.  HENT:  Head: Normocephalic and atraumatic.  Eyes: Conjunctivae and EOM are normal.  Neck: Neck supple. No tracheal deviation present.  Cardiovascular: Normal rate.   Pulmonary/Chest: Effort normal. No respiratory distress.  Musculoskeletal:  Normal range of motion.  Neurological: She is alert and oriented to person, place, and time.  Skin: Skin is warm and dry. Rash (right gluteal cleft with swelling, erythema, edema, ttp approximately 6cm wide with a 2 cm abscess in the middle) noted.  Psychiatric: She has a normal mood and affect. Her behavior is normal.  Nursing note and vitals reviewed.   ED Course  Procedures (including critical care time)  DIAGNOSTIC STUDIES: Oxygen Saturation is 95% on RA, normal by my interpretation.     COORDINATION OF CARE: 10:04 AM - Discussed treatment plan with pt at bedside which includes I&D. Pt verbalized understanding and agreed to plan.   INCISION AND DRAINAGE PROCEDURE NOTE: Patient identification was confirmed and verbal consent was obtained. This procedure was performed by Marily MemosJason Adalin Vanderploeg, MD at 7:49 PM. Site: Right buttock Sterile procedures observed Needle size: 27 Anesthetic used (type and amt): lidocaine w/ epi, 5 mL Blade size: 11 Drainage: moderate bloody purulent Complexity: Complex Packing used none Site anesthetized, incision made over site, wound drained and explored loculations, rinsed with copious amounts of normal saline, covered with dry, sterile dressing.  Pt tolerated procedure well without complications.  Instructions for care discussed verbally and pt provided with additional written instructions for homecare and f/u.    MDM   Final diagnoses:  Abscess and cellulitis    cillulitis surroundign abscess as above. I&D'ed, started abx, short course of pain meds.    I personally performed the services described in this documentation, which was scribed in my presence. The recorded information has been reviewed and is accurate.      Marily MemosJason Alainah Phang, MD 08/26/15 1950

## 2015-09-03 ENCOUNTER — Emergency Department (HOSPITAL_COMMUNITY): Payer: Medicaid Other

## 2015-09-03 ENCOUNTER — Encounter (HOSPITAL_COMMUNITY): Payer: Self-pay | Admitting: Emergency Medicine

## 2015-09-03 ENCOUNTER — Emergency Department (HOSPITAL_COMMUNITY)
Admission: EM | Admit: 2015-09-03 | Discharge: 2015-09-03 | Disposition: A | Discharge status: Home / Self Care | Payer: Medicaid Other | Attending: Emergency Medicine | Admitting: Emergency Medicine

## 2015-09-03 DIAGNOSIS — Z79899 Other long term (current) drug therapy: Secondary | ICD-10-CM | POA: Insufficient documentation

## 2015-09-03 DIAGNOSIS — Z87891 Personal history of nicotine dependence: Secondary | ICD-10-CM | POA: Insufficient documentation

## 2015-09-03 DIAGNOSIS — Z3202 Encounter for pregnancy test, result negative: Secondary | ICD-10-CM | POA: Insufficient documentation

## 2015-09-03 DIAGNOSIS — E119 Type 2 diabetes mellitus without complications: Secondary | ICD-10-CM | POA: Diagnosis not present

## 2015-09-03 DIAGNOSIS — Z7984 Long term (current) use of oral hypoglycemic drugs: Secondary | ICD-10-CM | POA: Diagnosis not present

## 2015-09-03 DIAGNOSIS — M5431 Sciatica, right side: Secondary | ICD-10-CM

## 2015-09-03 DIAGNOSIS — M5432 Sciatica, left side: Secondary | ICD-10-CM | POA: Insufficient documentation

## 2015-09-03 DIAGNOSIS — Z792 Long term (current) use of antibiotics: Secondary | ICD-10-CM | POA: Insufficient documentation

## 2015-09-03 DIAGNOSIS — M79605 Pain in left leg: Secondary | ICD-10-CM | POA: Diagnosis present

## 2015-09-03 DIAGNOSIS — I10 Essential (primary) hypertension: Secondary | ICD-10-CM | POA: Insufficient documentation

## 2015-09-03 DIAGNOSIS — J45909 Unspecified asthma, uncomplicated: Secondary | ICD-10-CM | POA: Diagnosis not present

## 2015-09-03 LAB — POC URINE PREG, ED: PREG TEST UR: NEGATIVE

## 2015-09-03 MED ORDER — CYCLOBENZAPRINE HCL 10 MG PO TABS
10.0000 mg | ORAL_TABLET | Freq: Once | ORAL | Status: AC
  Administered 2015-09-03: 10 mg via ORAL
  Filled 2015-09-03: qty 1

## 2015-09-03 MED ORDER — IBUPROFEN 800 MG PO TABS
800.0000 mg | ORAL_TABLET | Freq: Once | ORAL | Status: AC
Start: 2015-09-03 — End: 2015-09-03
  Administered 2015-09-03: 800 mg via ORAL
  Filled 2015-09-03: qty 1

## 2015-09-03 MED ORDER — HYDROCODONE-ACETAMINOPHEN 5-325 MG PO TABS: 1.0000 | ORAL_TABLET | ORAL | Status: DC | PRN

## 2015-09-03 MED ORDER — HYDROCODONE-ACETAMINOPHEN 5-325 MG PO TABS
1.0000 | ORAL_TABLET | Freq: Once | ORAL | Status: AC
  Administered 2015-09-03: 1 via ORAL
  Filled 2015-09-03: qty 1

## 2015-09-03 MED ORDER — IBUPROFEN 800 MG PO TABS: 800.0000 mg | ORAL_TABLET | Freq: Three times a day (TID) | ORAL | Status: DC

## 2015-09-03 MED ORDER — CYCLOBENZAPRINE HCL 5 MG PO TABS: 5.0000 mg | ORAL_TABLET | Freq: Three times a day (TID) | ORAL | Status: DC | PRN

## 2015-09-03 NOTE — Discharge Instructions (Signed)
Sciatica °Sciatica is pain, weakness, numbness, or tingling along the path of the sciatic nerve. The nerve starts in the lower back and runs down the back of each leg. The nerve controls the muscles in the lower leg and in the back of the knee, while also providing sensation to the back of the thigh, lower leg, and the sole of your foot. Sciatica is a symptom of another medical condition. For instance, nerve damage or certain conditions, such as a herniated disk or bone spur on the spine, pinch or put pressure on the sciatic nerve. This causes the pain, weakness, or other sensations normally associated with sciatica. Generally, sciatica only affects one side of the body. °CAUSES  °· Herniated or slipped disc. °· Degenerative disk disease. °· A pain disorder involving the narrow muscle in the buttocks (piriformis syndrome). °· Pelvic injury or fracture. °· Pregnancy. °· Tumor (rare). °SYMPTOMS  °Symptoms can vary from mild to very severe. The symptoms usually travel from the low back to the buttocks and down the back of the leg. Symptoms can include: °· Mild tingling or dull aches in the lower back, leg, or hip. °· Numbness in the back of the calf or sole of the foot. °· Burning sensations in the lower back, leg, or hip. °· Sharp pains in the lower back, leg, or hip. °· Leg weakness. °· Severe back pain inhibiting movement. °These symptoms may get worse with coughing, sneezing, laughing, or prolonged sitting or standing. Also, being overweight may worsen symptoms. °DIAGNOSIS  °Your caregiver will perform a physical exam to look for common symptoms of sciatica. He or she may ask you to do certain movements or activities that would trigger sciatic nerve pain. Other tests may be performed to find the cause of the sciatica. These may include: °· Blood tests. °· X-rays. °· Imaging tests, such as an MRI or CT scan. °TREATMENT  °Treatment is directed at the cause of the sciatic pain. Sometimes, treatment is not necessary  and the pain and discomfort goes away on its own. If treatment is needed, your caregiver may suggest: °· Over-the-counter medicines to relieve pain. °· Prescription medicines, such as anti-inflammatory medicine, muscle relaxants, or narcotics. °· Applying heat or ice to the painful area. °· Steroid injections to lessen pain, irritation, and inflammation around the nerve. °· Reducing activity during periods of pain. °· Exercising and stretching to strengthen your abdomen and improve flexibility of your spine. Your caregiver may suggest losing weight if the extra weight makes the back pain worse. °· Physical therapy. °· Surgery to eliminate what is pressing or pinching the nerve, such as a bone spur or part of a herniated disk. °HOME CARE INSTRUCTIONS  °· Only take over-the-counter or prescription medicines for pain or discomfort as directed by your caregiver. °· Apply ice to the affected area for 20 minutes, 3-4 times a day for the first 48-72 hours. Then try heat in the same way. °· Exercise, stretch, or perform your usual activities if these do not aggravate your pain. °· Attend physical therapy sessions as directed by your caregiver. °· Keep all follow-up appointments as directed by your caregiver. °· Do not wear high heels or shoes that do not provide proper support. °· Check your mattress to see if it is too soft. A firm mattress may lessen your pain and discomfort. °SEEK IMMEDIATE MEDICAL CARE IF:  °· You lose control of your bowel or bladder (incontinence). °· You have increasing weakness in the lower back, pelvis, buttocks,   or legs.  You have redness or swelling of your back.  You have a burning sensation when you urinate.  You have pain that gets worse when you lie down or awakens you at night.  Your pain is worse than you have experienced in the past.  Your pain is lasting longer than 4 weeks.  You are suddenly losing weight without reason. MAKE SURE YOU:  Understand these  instructions.  Will watch your condition.  Will get help right away if you are not doing well or get worse.   This information is not intended to replace advice given to you by your health care provider. Make sure you discuss any questions you have with your health care provider.   Document Released: 08/08/2001 Document Revised: 05/05/2015 Document Reviewed: 12/24/2011 Else[employer] Interactive Patient Education [employer]2016 [employer]   Use the medicines as directed.  Do not drive within 4 hours of taking hydrocodone as this will make you drowsy.  Avoid lifting,  Bending,  Twisting or any other activity that worsens your pain over the next week.  Apply an  icepack  to your lower back for 10-15 minutes every 2 hours for the next 2 days.  You should get rechecked if your symptoms are not better over the next 5 days,  Or you develop increased pain,  Weakness in your leg(s) or loss of bladder or bowel function - these are symptoms of a worse injury.

## 2015-09-03 NOTE — ED Notes (Signed)
Pt reports low mid back pain radiating down left leg starting 2 days ago.  Pt ambulatory .

## 2015-09-04 NOTE — ED Provider Notes (Signed)
CSN: [id number]     Arrival date & time 09/03/15  1428 History   First MD Initiated Contact with Patient 09/03/15 1454     Chief Complaint  Patient presents with  . Leg Pain     (Consider location/radiation/quality/duration/timing/severity/associated sxs/prior Treatment) The history is provided by the patient.   Ruth Campbell is a 30 y.o. female presenting with a 2 day history of bilateral low back pain with radiation into her left lateral thigh to her knee and into her right buttock.  She denies any injury or falls, but does endorse having a significant fall from a trampoline while a teenager with occasional back pain since but has never had radiation of her pain.  She denies urinary or fecal incontinence or retention and has no weakness in her legs.  She denies fevers or chills.  She was seen here last week for I&D of a right buttock abscess which she states is much improved and does not wish this re-examined today.  She is currently taking keflex.  She has taken ibuprofen without relief of her back pain.     Past Medical History  Diagnosis Date  . Hypertension   . Asthma   . Diabetes mellitus without complication (HCC)    History reviewed. No pertinent past surgical history. History reviewed. No pertinent family history. Social History  Substance Use Topics  . Smoking status: Former Games developer  . Smokeless tobacco: None  . Alcohol Use: No     Comment: occasional    OB History    No data available     Review of Systems  Constitutional: Negative for fever.  Respiratory: Negative for shortness of breath.   Cardiovascular: Negative for chest pain and leg swelling.  Gastrointestinal: Negative for abdominal pain, constipation and abdominal distention.  Genitourinary: Negative for dysuria, urgency, frequency, flank pain and difficulty urinating.  Musculoskeletal: Positive for back pain. Negative for joint swelling and gait problem.  Skin: Negative for rash.  Neurological:  Negative for weakness and numbness.      Allergies  Review of patient's allergies indicates no known allergies.  Home Medications   Prior to Admission medications   Medication Sig Start Date End Date Taking? Authorizing Provider  amLODipine (NORVASC) 5 MG tablet Take 5 mg by mouth daily.   Yes Historical Provider, MD  cephALEXin (KEFLEX) 500 MG capsule Take 1 capsule (500 mg total) by mouth 3 (three) times daily. 08/26/15  Yes Marily Memos, MD  linagliptin (TRADJENTA) 5 MG TABS tablet Take 5 mg by mouth daily.   Yes Historical Provider, MD  lisinopril-hydrochlorothiazide (PRINZIDE,ZESTORETIC) 10-12.5 MG per tablet Take 1 tablet by mouth daily.   Yes Historical Provider, MD  metFORMIN (GLUCOPHAGE) 500 MG tablet Take by mouth 2 (two) times daily with a meal.   Yes Historical Provider, MD  cyclobenzaprine (FLEXERIL) 5 MG tablet Take 1 tablet (5 mg total) by mouth 3 (three) times daily as needed for muscle spasms. 09/03/15   Burgess Amor, PA-C  HYDROcodone-acetaminophen (NORCO/VICODIN) 5-325 MG tablet Take 1 tablet by mouth every 4 (four) hours as needed. 09/03/15   Burgess Amor, PA-C  ibuprofen (ADVIL,MOTRIN) 800 MG tablet Take 1 tablet (800 mg total) by mouth 3 (three) times daily. 09/03/15   Burgess Amor, PA-C   BP 142/82 mmHg  Pulse 104  Temp(Src) 98.6 F (37 C) (Oral)  Resp 18  Ht 5\' 8"  (1.727 m)  Wt 136.079 kg  BMI 45.63 kg/m2  SpO2 100%  LMP 08/18/2015 Physical Exam  Constitutional: She  appears well-developed and well-nourished.  HENT:  Head: Normocephalic.  Eyes: Conjunctivae are normal.  Neck: Normal range of motion. Neck supple.  Cardiovascular: Normal rate and intact distal pulses.   Pedal pulses normal.  Pulmonary/Chest: Effort normal.  Abdominal: Soft. Bowel sounds are normal. She exhibits no distension and no mass.  Musculoskeletal: Normal range of motion. She exhibits tenderness. She exhibits no edema.       Lumbar back: She exhibits tenderness and bony tenderness. She  exhibits no swelling, no edema and no spasm.  ttp bilateral lumbar soft tissue and midline lumbar.  No step offs, no deformity or curvature noted.  Neurological: She is alert. She has normal strength. She displays no atrophy and no tremor. No sensory deficit. Gait normal.  Reflex Scores:      Patellar reflexes are 2+ on the right side and 2+ on the left side.      Achilles reflexes are 2+ on the right side and 2+ on the left side. No strength deficit noted in hip and knee flexor and extensor muscle groups.  Ankle flexion and extension intact.  Psychiatric: She has a normal mood and affect.  Nursing note and vitals reviewed.   ED Course  Procedures (including critical care time) Labs Review Labs Reviewed  POC URINE PREG, ED    Imaging Review Dg Lumbar Spine Complete  09/03/2015  CLINICAL DATA:  Low back pain extending across an down both legs for 2 months. EXAM: LUMBAR SPINE - COMPLETE 4+ VIEW COMPARISON:  04/18/2014 FINDINGS: Mild lumbar spondylosis with slight posterior osseous ridging at L2- 3 and L3-4. No malalignment. Disc spaces are preserved. No fracture or acute findings. IMPRESSION: 1. Mild lumbar posterior osseous ridging at L2-3 and L3-4. Correlating with the prior CT scan of 04/18/2014, the possibility of degenerative disc disease at these levels is raised, particularly at L2- 3. This could be further investigated with lumbar MRI with and without contrast if pain persists or fails to respond to conservative therapy. Electronically Signed   By: Gaylyn RongWalter  Liebkemann M.D.   On: 09/03/2015 15:58   I have personally reviewed and evaluated these images and lab results as part of my medical decision-making.   EKG Interpretation None      MDM   Final diagnoses:  Bilateral sciatica    Patients labs reviewed.  Radiological studies were viewed, interpreted and considered during the medical decision making and disposition process. I agree with radiologists reading.  Results were also  discussed with patient.  Pt placed on flexeril, hydrocodone, ibuprofen. Advised heat tx, activity as tolerated.  Advised f/u with pcp in 1 week for recheck.  Advised pt she may benefit from MRI if sx persist despite todays tx.  No neuro deficit on exam or by history to suggest emergent or surgical presentation.  Also discussed worsened sx that should prompt immediate re-evaluation including distal weakness, bowel/bladder retention/incontinence.         Burgess AmorJulie Belky Mundo, PA-C 09/04/15 1322  Bethann BerkshireJoseph Zammit, MD 09/06/15 [phone]

## 2016-05-05 ENCOUNTER — Encounter (HOSPITAL_COMMUNITY): Payer: Self-pay

## 2016-05-05 ENCOUNTER — Emergency Department (HOSPITAL_COMMUNITY)
Admission: EM | Admit: 2016-05-05 | Discharge: 2016-05-06 | Disposition: A | Discharge status: Home / Self Care | Payer: Medicaid Other | Attending: Emergency Medicine | Admitting: Emergency Medicine

## 2016-05-05 ENCOUNTER — Emergency Department (HOSPITAL_COMMUNITY)
Admission: EM | Admit: 2016-05-05 | Discharge: 2016-05-05 | Disposition: A | Discharge status: Home / Self Care | Payer: No Typology Code available for payment source

## 2016-05-05 DIAGNOSIS — Z87891 Personal history of nicotine dependence: Secondary | ICD-10-CM | POA: Diagnosis not present

## 2016-05-05 DIAGNOSIS — M545 Low back pain: Secondary | ICD-10-CM | POA: Diagnosis not present

## 2016-05-05 DIAGNOSIS — Z7984 Long term (current) use of oral hypoglycemic drugs: Secondary | ICD-10-CM | POA: Diagnosis not present

## 2016-05-05 DIAGNOSIS — S0990XA Unspecified injury of head, initial encounter: Secondary | ICD-10-CM | POA: Diagnosis present

## 2016-05-05 DIAGNOSIS — Y92410 Unspecified street and highway as the place of occurrence of the external cause: Secondary | ICD-10-CM | POA: Insufficient documentation

## 2016-05-05 DIAGNOSIS — J45909 Unspecified asthma, uncomplicated: Secondary | ICD-10-CM | POA: Insufficient documentation

## 2016-05-05 DIAGNOSIS — Z79899 Other long term (current) drug therapy: Secondary | ICD-10-CM | POA: Insufficient documentation

## 2016-05-05 DIAGNOSIS — Y999 Unspecified external cause status: Secondary | ICD-10-CM | POA: Insufficient documentation

## 2016-05-05 DIAGNOSIS — Y939 Activity, unspecified: Secondary | ICD-10-CM | POA: Diagnosis not present

## 2016-05-05 DIAGNOSIS — S0093XA Contusion of unspecified part of head, initial encounter: Secondary | ICD-10-CM

## 2016-05-05 DIAGNOSIS — M542 Cervicalgia: Secondary | ICD-10-CM | POA: Insufficient documentation

## 2016-05-05 DIAGNOSIS — M7918 Myalgia, other site: Secondary | ICD-10-CM

## 2016-05-05 DIAGNOSIS — E119 Type 2 diabetes mellitus without complications: Secondary | ICD-10-CM | POA: Insufficient documentation

## 2016-05-05 DIAGNOSIS — I10 Essential (primary) hypertension: Secondary | ICD-10-CM | POA: Diagnosis not present
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
## 2016-05-05 NOTE — ED Notes (Signed)
Per registration clerk, patient stated they are leaving

## 2016-05-06 MED ORDER — NAPROXEN 500 MG PO TABS: ORAL_TABLET | ORAL | 0 refills | Status: DC

## 2016-05-06 MED ORDER — CYCLOBENZAPRINE HCL 10 MG PO TABS
10.0000 mg | ORAL_TABLET | Freq: Once | ORAL | Status: AC
  Administered 2016-05-06: 10 mg via ORAL
  Filled 2016-05-06: qty 1

## 2016-05-06 MED ORDER — CYCLOBENZAPRINE HCL 5 MG PO TABS: 5.0000 mg | ORAL_TABLET | Freq: Three times a day (TID) | ORAL | 0 refills | Status: DC | PRN

## 2016-05-06 MED ORDER — NAPROXEN 250 MG PO TABS
500.0000 mg | ORAL_TABLET | Freq: Once | ORAL | Status: AC
  Administered 2016-05-06: 500 mg via ORAL
  Filled 2016-05-06: qty 2
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
## 2016-05-06 NOTE — Discharge Instructions (Signed)
Ice packs to the injured or sore muscles for the next several days and use heat to help your muscles relax. Take the medications for pain and muscle spasms. Return to the ED for any problems listed on the head injury sheet. Recheck if you aren't improving in the next week.

## 2016-05-08 ENCOUNTER — Ambulatory Visit (HOSPITAL_COMMUNITY)
Admission: RE | Admit: 2016-05-08 | Discharge: 2016-05-08 | Disposition: A | Discharge status: Home / Self Care | Payer: Medicaid Other | Source: Ambulatory Visit | Attending: Internal Medicine | Admitting: Internal Medicine

## 2016-05-08 ENCOUNTER — Other Ambulatory Visit (HOSPITAL_COMMUNITY): Payer: Self-pay | Admitting: Internal Medicine

## 2016-05-08 DIAGNOSIS — M549 Dorsalgia, unspecified: Secondary | ICD-10-CM | POA: Diagnosis present

## 2016-05-23 ENCOUNTER — Ambulatory Visit (HOSPITAL_COMMUNITY): Payer: Medicaid Other | Attending: Internal Medicine

## 2016-05-23 DIAGNOSIS — M6281 Muscle weakness (generalized): Secondary | ICD-10-CM | POA: Diagnosis present

## 2016-05-23 DIAGNOSIS — R293 Abnormal posture: Secondary | ICD-10-CM | POA: Insufficient documentation

## 2016-05-23 DIAGNOSIS — M5412 Radiculopathy, cervical region: Secondary | ICD-10-CM | POA: Insufficient documentation

## 2016-05-23 DIAGNOSIS — M5416 Radiculopathy, lumbar region: Secondary | ICD-10-CM | POA: Diagnosis present
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
## 2016-05-23 NOTE — Patient Instructions (Signed)
  CHIN TUCK - SUPINE  While lying on your back, tuck your chin towards your chest and press the back of your head into the table.  Maintain contact of head with the surface you are lying on the entire time.     Supine cervical rotation  Lie on your back, and gently turn your head one direction, then back to the center.  Repeat all one way, then begin turning the other direction    UPPER TRAP STRETCH - HAND ON HEAD  Begin by retracting your head back into a chin tuck position. Next, move your head towards one side with the help of hand.     Mckenzie Extension Progression  Prone on Elbows - Lay propped up on your elbows for 2-3 minutes. The pain or numbness should move closer to the lower back. If the pain moves toward the lower back, but seems to stop moving then progress to the second picture.  Stop the exercise if the pain or numbness moves further away from the lower back.  Prone press ups - midrange - Starting with your hands in push up position, press your chest up using only your arms. Only lift your chest off the bed. If the pain moves toward the center of your back but seems to stop moving then progress to the 3rd picture.  Stop the exercise if the pain or numbness moves further away from the lower back, or go back to prone press ups.   Modified Superman  Laying on stomach, raise opposite arm/leg alternate sides.    Superman  Raise both arms and both legs, relax and repeat.

## 2016-05-31 ENCOUNTER — Telehealth (HOSPITAL_COMMUNITY): Payer: Self-pay | Admitting: Physical Therapy

## 2016-05-31 ENCOUNTER — Ambulatory Visit (HOSPITAL_COMMUNITY): Payer: No Typology Code available for payment source | Attending: Internal Medicine | Admitting: Physical Therapy

## 2016-05-31 NOTE — Telephone Encounter (Signed)
Pt did not show for appointment.  Tried to reach by both numbers given, however unavailable and unable to leave a voice message.  Pt does not have a return appointment.   Lurena NidaAmy B Jo-Anne Kluth, PTA/CLT [phone]

## 2016-07-10 ENCOUNTER — Other Ambulatory Visit (HOSPITAL_COMMUNITY)
Admission: RE | Admit: 2016-07-10 | Discharge: 2016-07-10 | Disposition: A | Discharge status: Home / Self Care | Payer: Medicaid Other | Source: Ambulatory Visit | Attending: Adult Health | Admitting: Adult Health

## 2016-07-10 ENCOUNTER — Encounter: Payer: Self-pay | Admitting: Adult Health

## 2016-07-10 ENCOUNTER — Ambulatory Visit (INDEPENDENT_AMBULATORY_CARE_PROVIDER_SITE_OTHER): Payer: Medicaid Other | Admitting: Adult Health

## 2016-07-10 VITALS — BP 138/88 | HR 80 | Ht 67.75 in | Wt 349.0 lb

## 2016-07-10 DIAGNOSIS — N926 Irregular menstruation, unspecified: Secondary | ICD-10-CM | POA: Insufficient documentation

## 2016-07-10 DIAGNOSIS — Z01419 Encounter for gynecological examination (general) (routine) without abnormal findings: Secondary | ICD-10-CM | POA: Diagnosis present

## 2016-07-10 DIAGNOSIS — Z319 Encounter for procreative management, unspecified: Secondary | ICD-10-CM

## 2016-07-10 DIAGNOSIS — Z0000 Encounter for general adult medical examination without abnormal findings: Secondary | ICD-10-CM | POA: Diagnosis not present

## 2016-07-10 DIAGNOSIS — Z3202 Encounter for pregnancy test, result negative: Secondary | ICD-10-CM | POA: Diagnosis not present

## 2016-07-10 DIAGNOSIS — Z113 Encounter for screening for infections with a predominantly sexual mode of transmission: Secondary | ICD-10-CM | POA: Diagnosis present

## 2016-07-10 LAB — POCT URINE PREGNANCY: Preg Test, Ur: NEGATIVE

## 2016-07-10 MED ORDER — PRENATAL PLUS 27-1 MG PO TABS: 1.0000 | ORAL_TABLET | Freq: Every day | ORAL | 12 refills | Status: DC

## 2016-07-10 NOTE — Patient Instructions (Addendum)
Call with next period, will check progesterone level Physical in 1 year, pap in 3 if normal  Preparing for Pregnancy Before trying to become pregnant, make an appointment with your health care provider (preconception care). The goal is to help you have a healthy, safe pregnancy. At your first appointment, your health care provider will:   Do a complete physical exam, including a Pap test.  Take a complete medical history.  Give you advice and help you resolve any problems. PRECONCEPTION CHECKLIST Here is a list of the basics to cover with your health care provider at your preconception visit:  Medical history.  Tell your health care provider about any diseases you have had. Many diseases can affect your pregnancy.  Include your partner's medical history and family history.  Make sure you have been tested for sexually transmitted infections (STIs). These can affect your pregnancy. In some cases, they can be passed to your baby. Tell your health care provider about any history of STIs.  Make sure your health care provider knows about any previous problems you have had with conception or pregnancy.  Tell your health care provider about any medicine you take. This includes herbal supplements and over-the-counter medicines.  Make sure all your immunizations are up to date. You may need to make additional appointments.  Ask your health care provider if you need any vaccinations or if there are any you should avoid.  Diet.  It is especially important to eat a healthy, balanced diet with the right nutrients when you are pregnant.  Ask your health care provider to help you get to a healthy weight before pregnancy.  If you are overweight, you are at higher risk for certain complications. These include high blood pressure, diabetes, and preterm birth.  If you are underweight, you are more likely to have a low-birth-weight baby.  Lifestyle.  Tell your health care provider about lifestyle  factors such as alcohol use, drug use, or smoking.  Describe any harmful substances you may be exposed to at work or home. These can include chemicals, pesticides, and radiation.  Mental health.  Let your health care provider know if you have been feeling depressed or anxious.  Let your health care provider know if you have a history of substance abuse.  Let your health care provider know if you do not feel safe at home. HOME INSTRUCTIONS TO PREPARE FOR PREGNANCY Follow your health care provider's advice and instructions.   Keep an accurate record of your menstrual periods. This makes it easier for your health care provider to determine your baby's due date.  Begin taking prenatal vitamins and folic acid supplements daily. Take them as directed by your health care provider.  Eat a balanced diet. Get help from a nutrition counselor if you have questions or need help.  Get regular exercise. Try to be active for at least 30 minutes a day most days of the week.  Quit smoking, if you smoke.  Do not drink alcohol.  Do not take illegal drugs.  Get medical problems, such as diabetes or high blood pressure, under control.  If you have diabetes, make sure you do the following:  Have good blood sugar control. If you have type 1 diabetes, use multiple daily doses of insulin. Do not use split-dose or premixed insulin.  Have an eye exam by a qualified eye care professional trained in caring for people with diabetes.  Get evaluated by your health care provider for cardiovascular disease.  Get to a  healthy weight. If you are overweight or obese, reduce your weight with the help of a qualified health professional such as a Museum/gallery exhibitions officerregistered dietitian. Ask your health care provider what the right weight range is for you. HOW DO I KNOW I AM PREGNANT? You may be pregnant if you have been sexually active and you miss your period. Symptoms of early pregnancy include:   Mild cramping.  Very light  vaginal bleeding (spotting).  Feeling unusually tired.  Morning sickness. If you have any of these symptoms, take a home pregnancy test. These tests look for a hormone called human chorionic gonadotropin (hCG) in your urine. Your body begins to make this hormone during early pregnancy. These tests are very accurate. Wait until at least the first day you miss your period to take one. If you get a positive result, call your health care provider to make appointments for prenatal care. WHAT SHOULD I DO IF I BECOME PREGNANT?  Make an appointment with your health care provider by week 12 of your pregnancy at the latest.  Do not smoke. Smoking can be harmful to your baby.  Do not drink alcoholic beverages. Alcohol is related to a number of birth defects.  Avoid toxic odors and chemicals.  You may continue to have sexual intercourse if it does not cause pain or other problems, such as vaginal bleeding.   This information is not intended to replace advice given to you by your health care provider. Make sure you discuss any questions you have with your health care provider.   Document Released: 07/27/2008 Document Revised: 09/04/2014 Document Reviewed: 07/21/2013 Elsevier Interactive Patient Education [employer]2016 [employer] Inc.

## 2016-07-10 NOTE — Progress Notes (Signed)
Patient ID: Ruth Campbell, female   DOB: Jan 19, 1986, 30 y.o   MRN: [id number] History of Present Illness: Ruth is a 30 year old black female in for will woman gyn exam and pap.She is a new pt.She complains of periods being irregular,(may skip periods ir they are light) and she wants to get pregnant, has not been on birth control in years. She has had labs with Dr Felecia ShellingFanta but not sure of results.  PCP is Dr Felecia ShellingFanta.   Current Medications, Allergies, Past Medical History, Past Surgical History, Family History and Social History were reviewed in [facility] electronic medical record.     Review of Systems: Patient denies any headaches, hearing loss, fatigue, blurred vision, shortness of breath, chest pain, abdominal pain, problems with bowel movements, urination, or intercourse. No joint pain or mood swings.+ irregular periods     Physical Exam:BP 138/88 (BP Location: Left Arm, Patient Position: Sitting, Cuff Size: Large)   Pulse 80   Ht 5' 7.75" (1.721 m)   Wt (!) 349 lb (158.3 kg)   LMP 06/07/2016   BMI 53.46 kg/m UPT negative General:  Well developed, well nourished, no acute distress Skin:  Warm and dry Neck:  Midline trachea, normal thyroid, good ROM, no lymphadenopathy Lungs; Clear to auscultation bilaterally Breast:  No dominant palpable mass, retraction, or nipple discharge Cardiovascular: Regular rate and rhythm Abdomen:  Soft, non tender, no hepatosplenomegaly Pelvic:  External genitalia is normal in appearance, no lesions.  The vagina is normal in appearance. Urethra has no lesions or masses. The cervix is smooth, pap with GC/CHL performed.  Uterus is felt to be normal size, shape, and contour.  No adnexal masses or tenderness noted.Bladder is non tender, no masses felt. Extremities/musculoskeletal:  No swelling or varicosities noted, no clubbing or cyanosis Psych:  No mood changes, alert and cooperative,seems happy PHQ 2 score 1.Discussed that need to assess to se if  ovulating and may need to change meds then.   Impression: 1. Pap smear, as part of routine gynecological examination   2. Irregular periods   3. Patient desires pregnancy       Plan: Rx prenatal plus #30 take 1 daily with 12 refills Call with next period, will check progesterone level Physical in 1 year, pap in 3 if normal  Review handout on preparing for pregnancy

## 2016-07-11 LAB — CYTOLOGY - PAP
Chlamydia: NEGATIVE
Diagnosis: NEGATIVE
Neisseria Gonorrhea: NEGATIVE

## 2016-08-08 ENCOUNTER — Emergency Department (HOSPITAL_COMMUNITY)
Admission: EM | Admit: 2016-08-08 | Discharge: 2016-08-08 | Disposition: A | Discharge status: Home / Self Care | Payer: Medicaid Other | Attending: Emergency Medicine | Admitting: Emergency Medicine

## 2016-08-08 ENCOUNTER — Encounter (HOSPITAL_COMMUNITY): Payer: Self-pay | Admitting: Emergency Medicine

## 2016-08-08 ENCOUNTER — Emergency Department (HOSPITAL_COMMUNITY): Payer: Medicaid Other

## 2016-08-08 DIAGNOSIS — Z79899 Other long term (current) drug therapy: Secondary | ICD-10-CM | POA: Diagnosis not present

## 2016-08-08 DIAGNOSIS — Y999 Unspecified external cause status: Secondary | ICD-10-CM | POA: Diagnosis not present

## 2016-08-08 DIAGNOSIS — M79671 Pain in right foot: Secondary | ICD-10-CM | POA: Insufficient documentation

## 2016-08-08 DIAGNOSIS — J45909 Unspecified asthma, uncomplicated: Secondary | ICD-10-CM | POA: Diagnosis not present

## 2016-08-08 DIAGNOSIS — Z87891 Personal history of nicotine dependence: Secondary | ICD-10-CM | POA: Diagnosis not present

## 2016-08-08 DIAGNOSIS — S99922A Unspecified injury of left foot, initial encounter: Secondary | ICD-10-CM | POA: Diagnosis present

## 2016-08-08 DIAGNOSIS — E119 Type 2 diabetes mellitus without complications: Secondary | ICD-10-CM | POA: Insufficient documentation

## 2016-08-08 DIAGNOSIS — Z7984 Long term (current) use of oral hypoglycemic drugs: Secondary | ICD-10-CM | POA: Insufficient documentation

## 2016-08-08 DIAGNOSIS — W208XXA Other cause of strike by thrown, projected or falling object, initial encounter: Secondary | ICD-10-CM | POA: Diagnosis not present

## 2016-08-08 DIAGNOSIS — S9032XA Contusion of left foot, initial encounter: Secondary | ICD-10-CM | POA: Insufficient documentation

## 2016-08-08 DIAGNOSIS — I10 Essential (primary) hypertension: Secondary | ICD-10-CM | POA: Diagnosis not present

## 2016-08-08 DIAGNOSIS — Y929 Unspecified place or not applicable: Secondary | ICD-10-CM | POA: Insufficient documentation

## 2016-08-08 DIAGNOSIS — Y9389 Activity, other specified: Secondary | ICD-10-CM | POA: Diagnosis not present

## 2016-08-08 MED ORDER — DICLOFENAC SODIUM 75 MG PO TBEC: 75.0000 mg | DELAYED_RELEASE_TABLET | Freq: Two times a day (BID) | ORAL | 0 refills | Status: DC

## 2016-08-08 NOTE — ED Triage Notes (Signed)
Pt states she was helping someone move and a TV fell on her foot. TV was not a newer model.

## 2016-08-08 NOTE — ED Provider Notes (Signed)
[facility]EMERG[facility] DEPT Provider Note   CSN: [id number] Arrival date & time: 08/08/16  1135     History   Chief Complaint Chief Complaint  Patient presents with  . Foot Pain    HPI Ruth Campbell is a 30 y.o. female.  HPI  Ruth Campbell is a 30 y.o. female who presents to the Emergency Department complaining of left foot pain for several days.  She states that she dropped a TV on her foot.  Pain to her foot is worse with weight bearing.  She has continued pain and swelling.  She has not tried any therapies.  She denies calf pain, knee pain, numbness or open wounds.  Past Medical History:  Diagnosis Date  . Asthma   . Diabetes mellitus without complication (HCC)   . Hypertension   . Obesity     Patient Active Problem List   Diagnosis Date Noted  . Pap smear, as part of routine gynecological examination 07/10/2016  . Irregular periods 07/10/2016    History reviewed. No pertinent surgical history.  OB History    Gravida Para Term Preterm AB Living   0 0 0 0 0 0   SAB TAB Ectopic Multiple Live Births   0 0 0 0 0       Home Medications    Prior to Admission medications   Medication Sig Start Date End Date Taking? Authorizing Provider  amLODipine (NORVASC) 10 MG tablet Take 10 mg by mouth daily. 04/03/16   Historical Provider, MD  cyclobenzaprine (FLEXERIL) 5 MG tablet Take 1 tablet (5 mg total) by mouth 3 (three) times daily as needed. Patient not taking: Reported on 07/10/2016 05/06/16   Devoria AlbeIva Knapp, MD  diclofenac (VOLTAREN) 75 MG EC tablet Take 1 tablet (75 mg total) by mouth 2 (two) times daily. Take with food 08/08/16   Cohen Doleman, PA-C  INVOKANA 100 MG TABS tablet Take 100 mg by mouth daily. 04/03/16   Historical Provider, MD  linagliptin (TRADJENTA) 5 MG TABS tablet Take 5 mg by mouth daily.    Historical Provider, MD  lisinopril-hydrochlorothiazide (PRINZIDE,ZESTORETIC) 20-12.5 MG tablet Take 1 tablet by mouth daily. 04/03/16   Historical Provider, MD  metFORMIN  (GLUCOPHAGE) 1000 MG tablet Take 1,000 mg by mouth 2 (two) times daily. 03/31/16   Historical Provider, MD  prenatal vitamin w/FE, FA (PRENATAL 1 + 1) 27-1 MG TABS tablet Take 1 tablet by mouth daily at 12 noon. 07/10/16   Adline PotterJennifer A Griffin, NP    Family History Family History  Problem Relation Age of Onset  . Cancer Paternal Grandmother     bone  . Cancer Father     prostate  . Hypertension Father   . Hypertension Mother   . Heart disease Mother     Social History Social History  Substance Use Topics  . Smoking status: Former Games developermoker  . Smokeless tobacco: Never Used  . Alcohol use No     Comment: occasional      Allergies   Patient has no known allergies.   Review of Systems Review of Systems  Constitutional: Negative for chills and fever.  Musculoskeletal: Positive for arthralgias and joint swelling.       Right foot pain  Skin: Negative for color change and wound.  Neurological: Negative for weakness and numbness.  All other systems reviewed and are negative.    Physical Exam Updated Vital Signs BP 160/99 (BP Location: Left Wrist)   Pulse 80   Temp 97.6 F (36.4 C) (Oral)  Resp 18   Ht 5\' 9"  (1.753 m)   Wt (!) 142.9 kg   LMP 07/27/2016   SpO2 100%   BMI 46.52 kg/m   Physical Exam  Constitutional: She is oriented to person, place, and time. She appears well-developed and well-nourished. No distress.  Pt is obese  HENT:  Head: Normocephalic and atraumatic.  Cardiovascular: Normal rate, regular rhythm and intact distal pulses.   Pulmonary/Chest: Effort normal and breath sounds normal.  Musculoskeletal: She exhibits tenderness. She exhibits no deformity.  Diffuse tenderness of the dorsal left foot.  Mild edema.  DP pulse is brisk,distal sensation intact.  No erythema, abrasion, bruising or bony deformity.  No proximal tenderness.  Neurological: She is alert and oriented to person, place, and time. She exhibits normal muscle tone. Coordination normal.    Skin: Skin is warm and dry.  Nursing note and vitals reviewed.    ED Treatments / Results  Labs (all labs ordered are listed, but only abnormal results are displayed) Labs Reviewed - No data to display  EKG  EKG Interpretation None       Radiology Dg Foot Complete Left  Result Date: 08/08/2016 CLINICAL DATA:  Heavy object fell on foot Friday. Pain and swelling. EXAM: LEFT FOOT - COMPLETE 3+ VIEW COMPARISON:  None. FINDINGS: Soft tissue swelling along the dorsum of the foot. No acute bony abnormality. Specifically, no fracture, subluxation, or dislocation. Soft tissues are intact. IMPRESSION: No acute bony abnormality. Electronically Signed   By: Charlett NoseKevin  Dover M.D.   On: 08/08/2016 12:31    Procedures Procedures (including critical care time)  Medications Ordered in ED Medications - No data to display   Initial Impression / Assessment and Plan / ED Course  I have reviewed the triage vital signs and the nursing notes.  Pertinent labs & imaging results that were available during my care of the patient were reviewed by me and considered in my medical decision making (see chart for details).  Clinical Course     Likely contusion to the foot.  Neg for fx.    Crutches and post op shoe given.  Agrees to symptomatic tx and ortho f/u if needed.   Final Clinical Impressions(s) / ED Diagnoses   Final diagnoses:  Contusion of left foot, initial encounter    New Prescriptions Discharge Medication List as of 08/08/2016  2:02 PM    START taking these medications   Details  diclofenac (VOLTAREN) 75 MG EC tablet Take 1 tablet (75 mg total) by mouth 2 (two) times daily. Take with food, Starting Tue 08/08/2016, Print         [place] Memmer [place], PA-C 08/08/16 2030    Donnetta HutchingBrian Cook, MD 08/11/16 [phone]

## 2016-08-08 NOTE — Discharge Instructions (Signed)
Elevate and apply ice packs on/off to your foot.  Use the crutches for weight bearing.  Call the foot doctor listed to arrange a follow-up appt in one week if not improving

## 2016-10-31 DIAGNOSIS — E119 Type 2 diabetes mellitus without complications: Secondary | ICD-10-CM | POA: Insufficient documentation

## 2016-10-31 DIAGNOSIS — I10 Essential (primary) hypertension: Secondary | ICD-10-CM | POA: Insufficient documentation

## 2016-11-01 ENCOUNTER — Ambulatory Visit: Payer: Medicaid Other | Admitting: Family Medicine

## 2016-12-11 ENCOUNTER — Encounter (HOSPITAL_COMMUNITY): Payer: Self-pay

## 2016-12-11 ENCOUNTER — Emergency Department (HOSPITAL_COMMUNITY)
Admission: EM | Admit: 2016-12-11 | Discharge: 2016-12-11 | Disposition: A | Discharge status: Home / Self Care | Payer: Medicaid Other | Attending: Dermatology | Admitting: Dermatology

## 2016-12-11 DIAGNOSIS — Z87891 Personal history of nicotine dependence: Secondary | ICD-10-CM | POA: Diagnosis not present

## 2016-12-11 DIAGNOSIS — R42 Dizziness and giddiness: Secondary | ICD-10-CM | POA: Diagnosis present

## 2016-12-11 DIAGNOSIS — E119 Type 2 diabetes mellitus without complications: Secondary | ICD-10-CM | POA: Diagnosis not present

## 2016-12-11 DIAGNOSIS — J45909 Unspecified asthma, uncomplicated: Secondary | ICD-10-CM | POA: Diagnosis not present

## 2016-12-11 DIAGNOSIS — I10 Essential (primary) hypertension: Secondary | ICD-10-CM | POA: Insufficient documentation

## 2016-12-11 DIAGNOSIS — Z7984 Long term (current) use of oral hypoglycemic drugs: Secondary | ICD-10-CM | POA: Diagnosis not present

## 2016-12-11 DIAGNOSIS — Z5321 Procedure and treatment not carried out due to patient leaving prior to being seen by health care provider: Secondary | ICD-10-CM | POA: Insufficient documentation

## 2016-12-11 LAB — CBG MONITORING, ED: Glucose-Capillary: 230 mg/dL — ABNORMAL HIGH (ref 65–99)

## 2016-12-11 NOTE — ED Triage Notes (Signed)
Pt. Went to PCP this morning and had a CBG of over 300. BP was high as well, but doesn't remember what it was. Pt. Has a history of type 2 DM. Pt. Reports feeling week and tired. Pt. Feels as if she's about to pass out when she gets up from sitting.

## 2017-11-01 ENCOUNTER — Emergency Department (HOSPITAL_COMMUNITY)
Admission: EM | Admit: 2017-11-01 | Discharge: 2017-11-01 | Disposition: A | Discharge status: Home / Self Care | Payer: Medicaid Other | Attending: Emergency Medicine | Admitting: Emergency Medicine

## 2017-11-01 ENCOUNTER — Emergency Department (HOSPITAL_COMMUNITY): Payer: Medicaid Other

## 2017-11-01 ENCOUNTER — Other Ambulatory Visit: Payer: Self-pay

## 2017-11-01 ENCOUNTER — Encounter (HOSPITAL_COMMUNITY): Payer: Self-pay | Admitting: Emergency Medicine

## 2017-11-01 DIAGNOSIS — Z7984 Long term (current) use of oral hypoglycemic drugs: Secondary | ICD-10-CM | POA: Insufficient documentation

## 2017-11-01 DIAGNOSIS — J45909 Unspecified asthma, uncomplicated: Secondary | ICD-10-CM | POA: Diagnosis not present

## 2017-11-01 DIAGNOSIS — J111 Influenza due to unidentified influenza virus with other respiratory manifestations: Secondary | ICD-10-CM | POA: Insufficient documentation

## 2017-11-01 DIAGNOSIS — E119 Type 2 diabetes mellitus without complications: Secondary | ICD-10-CM | POA: Insufficient documentation

## 2017-11-01 DIAGNOSIS — I10 Essential (primary) hypertension: Secondary | ICD-10-CM | POA: Insufficient documentation

## 2017-11-01 DIAGNOSIS — R69 Illness, unspecified: Secondary | ICD-10-CM

## 2017-11-01 DIAGNOSIS — Z79899 Other long term (current) drug therapy: Secondary | ICD-10-CM | POA: Insufficient documentation

## 2017-11-01 DIAGNOSIS — Z87891 Personal history of nicotine dependence: Secondary | ICD-10-CM | POA: Insufficient documentation

## 2017-11-01 DIAGNOSIS — R05 Cough: Secondary | ICD-10-CM | POA: Diagnosis present

## 2017-11-01 MED ORDER — ACETAMINOPHEN 500 MG PO TABS
ORAL_TABLET | ORAL | Status: AC
  Administered 2017-11-01: 1000 mg via ORAL
  Filled 2017-11-01: qty 2

## 2017-11-01 MED ORDER — OSELTAMIVIR PHOSPHATE 75 MG PO CAPS: 75.0000 mg | ORAL_CAPSULE | Freq: Two times a day (BID) | ORAL | 0 refills | Status: DC

## 2017-11-01 MED ORDER — BENZONATATE 100 MG PO CAPS: 100.0000 mg | ORAL_CAPSULE | Freq: Three times a day (TID) | ORAL | 0 refills | Status: DC | PRN

## 2017-11-01 MED ORDER — ACETAMINOPHEN 500 MG PO TABS
1000.0000 mg | ORAL_TABLET | Freq: Once | ORAL | Status: AC
  Administered 2017-11-01: 1000 mg via ORAL

## 2017-11-01 NOTE — ED Triage Notes (Signed)
Patient c/o cough with fever, body aches, and sore throat. Cough productive with thick green/yellow sputum. Patient reports taking Mucinex last night with no relief. Patient states this morning small amount of blood in sputum. Denies nausea but states "coughing so hard gaging."

## 2017-11-01 NOTE — ED Triage Notes (Deleted)
Patient c/o productive cough with chest congestion, fatigue, and bilateral yellow eye drainage x4 days. Denies any fevers, nausea, vomiting, or diarrhea. Patient taking tessalon pearls that PCP called in with no relief.  

## 2017-11-01 NOTE — ED Provider Notes (Signed)
[facility] Provider Note   CSN: [id number] Arrival date & time: 11/01/17  1111     History   Chief Complaint Chief Complaint  Patient presents with  . Cough    HPI [name] is a 32 y.o. female.  HPI With acute onset chills, fever, myalgias, sore throat, headache, productive cough 1.5 days ago.  States that she has been coughing heavily and there is a small amount of blood when she spit up.  Denies any abdominal pain.  No known sick contacts. Past Medical History:  Diagnosis Date  . Asthma   . Diabetes mellitus without complication (HCC)   . Hypertension   . Obesity     Patient Active Problem List   Diagnosis Date Noted  . Obesity, morbid, BMI 50 or higher (HCC) 10/31/2016  . Type 2 diabetes mellitus without complications (HCC) 10/31/2016  . Hypertension 10/31/2016  . Irregular periods 07/10/2016    History reviewed. No pertinent surgical history.  OB History    Gravida Para Term Preterm AB Living   0 0 0 0 0 0   SAB TAB Ectopic Multiple Live Births   0 0 0 0 0       Home Medications    Prior to Admission medications   Medication Sig Start Date End Date Taking? Authorizing Provider  amLODipine (NORVASC) 10 MG tablet Take 10 mg by mouth daily. 04/03/16  Yes [provider]  INVOKANA 100 MG TABS tablet Take 100 mg by mouth daily. 04/03/16  Yes [provider]  linagliptin (TRADJENTA) 5 MG TABS tablet Take 5 mg by mouth daily.   Yes [provider]  lisinopril-hydrochlorothiazide (PRINZIDE,ZESTORETIC) 20-12.5 MG tablet Take 1 tablet by mouth daily. 04/03/16  Yes [provider]  metFORMIN (GLUCOPHAGE) 1000 MG tablet Take 1,000 mg by mouth 2 (two) times daily. 03/31/16  Yes [provider]  benzonatate (TESSALON) 100 MG capsule Take 1 capsule (100 mg total) by mouth 3 (three) times daily as needed for cough. 11/01/17   Loren RacerYelverton, Hazeline Charnley, MD  oseltamivir (TAMIFLU) 75 MG capsule Take 1 capsule (75 mg  total) by mouth every 12 (twelve) hours. 11/01/17   Loren RacerYelverton, Gevorg Brum, MD    Family History Family History  Problem Relation Age of Onset  . Cancer Paternal Grandmother        bone  . Cancer Father        prostate  . Hypertension Father   . Hypertension Mother   . Heart disease Mother     Social History Social History   Tobacco Use  . Smoking status: Former Smoker    Types: Cigarettes  . Smokeless tobacco: Never Used  Substance Use Topics  . Alcohol use: No    Frequency: Never  . Drug use: No     Allergies   Patient has no known allergies.   Review of Systems Review of Systems  Constitutional: Positive for chills, fatigue and fever.  HENT: Positive for congestion, rhinorrhea and sore throat. Negative for facial swelling and trouble swallowing.   Eyes: Negative for visual disturbance.  Respiratory: Positive for cough. Negative for chest tightness, shortness of breath and wheezing.   Cardiovascular: Negative for chest pain, palpitations and leg swelling.  Gastrointestinal: Negative for abdominal pain, diarrhea, nausea and vomiting.  Genitourinary: Negative for dysuria, flank pain and frequency.  Musculoskeletal: Positive for myalgias. Negative for back pain, joint swelling, neck pain and neck stiffness.  Neurological: Positive for headaches. Negative for dizziness, speech difficulty, weakness, light-headedness  and numbness.  All other systems reviewed and are negative.    Physical Exam Updated Vital Signs BP (!) 157/99 (BP Location: Right Arm)   Pulse (S) (!) 109   Temp 99.3 F (37.4 C) (Oral)   Resp 18   Ht 5\' 9"  (1.753 m)   Wt 131.1 kg (289 lb)   LMP 08/28/2017   SpO2 97%   BMI 42.68 kg/m   Physical Exam  Constitutional: She is oriented to person, place, and time. She appears well-developed and well-nourished. No distress.  HENT:  Head: Normocephalic and atraumatic.  Mouth/Throat: Oropharynx is clear and moist.  Oropharynx is mildly erythematous.  No  tonsillar exudates.  Uvula is midline.  Mild bilateral nasal mucosal edema.  Eyes: EOM are normal. Pupils are equal, round, and reactive to light.  Neck: Normal range of motion. Neck supple.  No meningismus  Cardiovascular: Normal rate and regular rhythm. Exam reveals no gallop and no friction rub.  No murmur heard. Pulmonary/Chest: Effort normal and breath sounds normal. No stridor. No respiratory distress. She has no wheezes. She has no rales. She exhibits no tenderness.  Abdominal: Soft. Bowel sounds are normal. There is no tenderness. There is no rebound and no guarding.  Musculoskeletal: Normal range of motion. She exhibits no edema or tenderness.  No CVA tenderness.  No lower extremity swelling, asymmetry or tenderness.  Distal pulses are 2+.  Lymphadenopathy:    She has cervical adenopathy.  Neurological: She is alert and oriented to person, place, and time.  Patient is alert and oriented x3 with clear, goal oriented speech. Patient has 5/5 motor in all extremities. Sensation is intact to light touch.   Skin: Skin is warm and dry. Capillary refill takes less than 2 seconds. No rash noted. She is not diaphoretic. No erythema.  Psychiatric: She has a normal mood and affect. Her behavior is normal.  Nursing note and vitals reviewed.    ED Treatments / Results  Labs (all labs ordered are listed, but only abnormal results are displayed) Labs Reviewed - No data to display  EKG  EKG Interpretation None       Radiology Dg Chest 2 View  Result Date: 11/01/2017 CLINICAL DATA:  Productive cough, shortness of breath, fever and chills for 1 1/2 days. EXAM: CHEST - 2 VIEW COMPARISON:  02/19/2015 FINDINGS: The heart size and mediastinal contours are within normal limits. Both lungs are clear. The visualized skeletal structures are unremarkable. IMPRESSION: No active cardiopulmonary disease. Electronically Signed   By: Norva Pavlov M.D.   On: 11/01/2017 13:46    Procedures Procedures  (including critical care time)  Medications Ordered in ED Medications - No data to display   Initial Impression / Assessment and Plan / ED Course  I have reviewed the triage vital signs and the nursing notes.  Pertinent labs & imaging results that were available during my care of the patient were reviewed by me and considered in my medical decision making (see chart for details).     Patient is very well-appearing.  Describes flulike illness.  Chest x-ray negative for pneumonia.  Given comorbidities, will start on Tamiflu.  Return precautions have been given.  Final Clinical Impressions(s) / ED Diagnoses   Final diagnoses:  Influenza-like illness    ED Discharge Orders        Ordered    oseltamivir (TAMIFLU) 75 MG capsule  Every 12 hours     11/01/17 1558    benzonatate (TESSALON) 100 MG capsule  3  times daily PRN     11/01/17 1558       Loren Racer, MD 11/01/17 1605

## 2018-01-23 LAB — HEMOGLOBIN A1C: Hemoglobin A1C: 11.7

## 2018-01-23 LAB — BASIC METABOLIC PANEL
BUN: 12 (ref 4–21)
CREATININE: 0.8 (ref 0.5–1.1)

## 2018-03-17 IMAGING — DX DG CHEST 2V
2 series · 2 of 2 positions shown · non-contrast
Comparison: 02/19/2015

CLINICAL DATA: Productive cough, shortness of breath, fever and
chills for 1 [DATE] days.

EXAM:
CHEST - 2 VIEW

[chest pa]
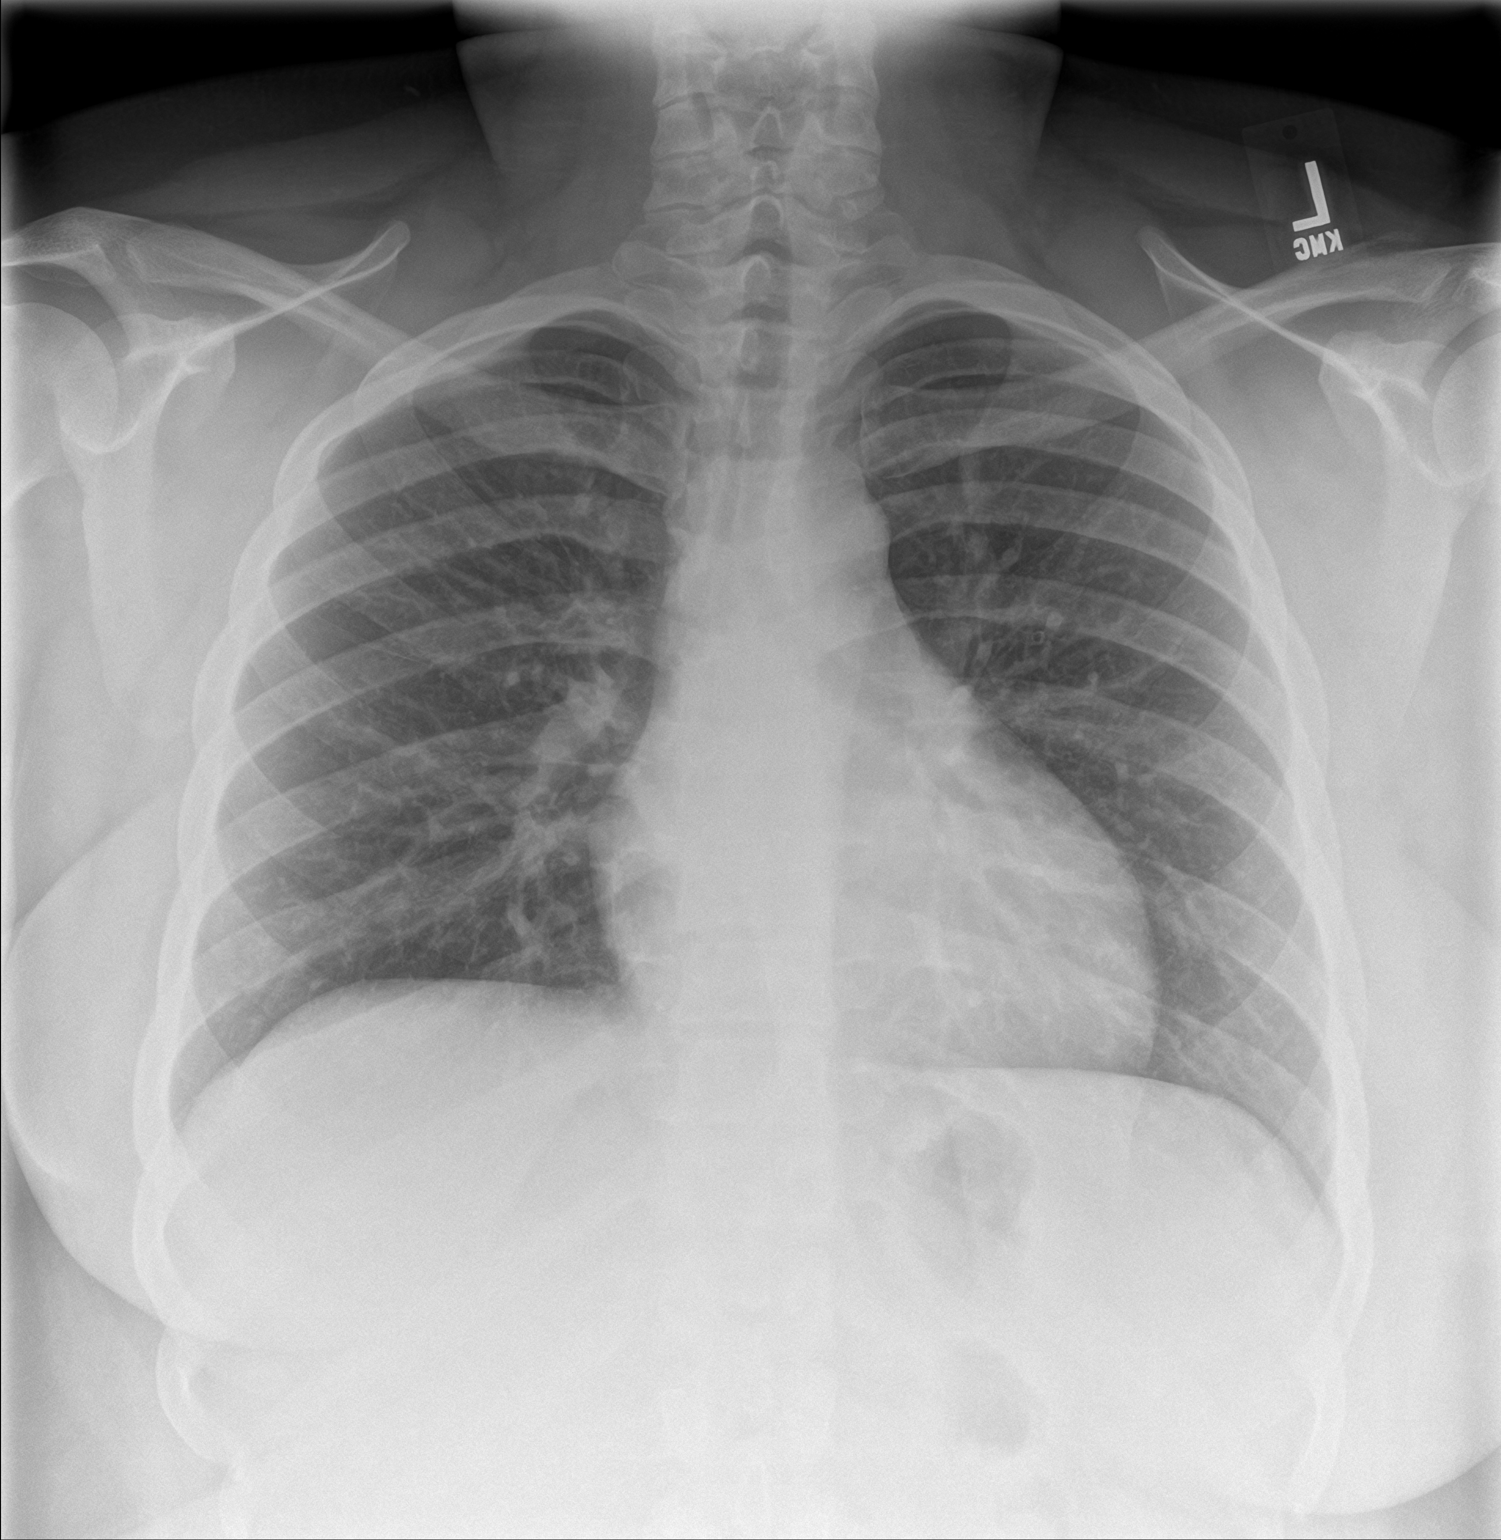

[chest lat]
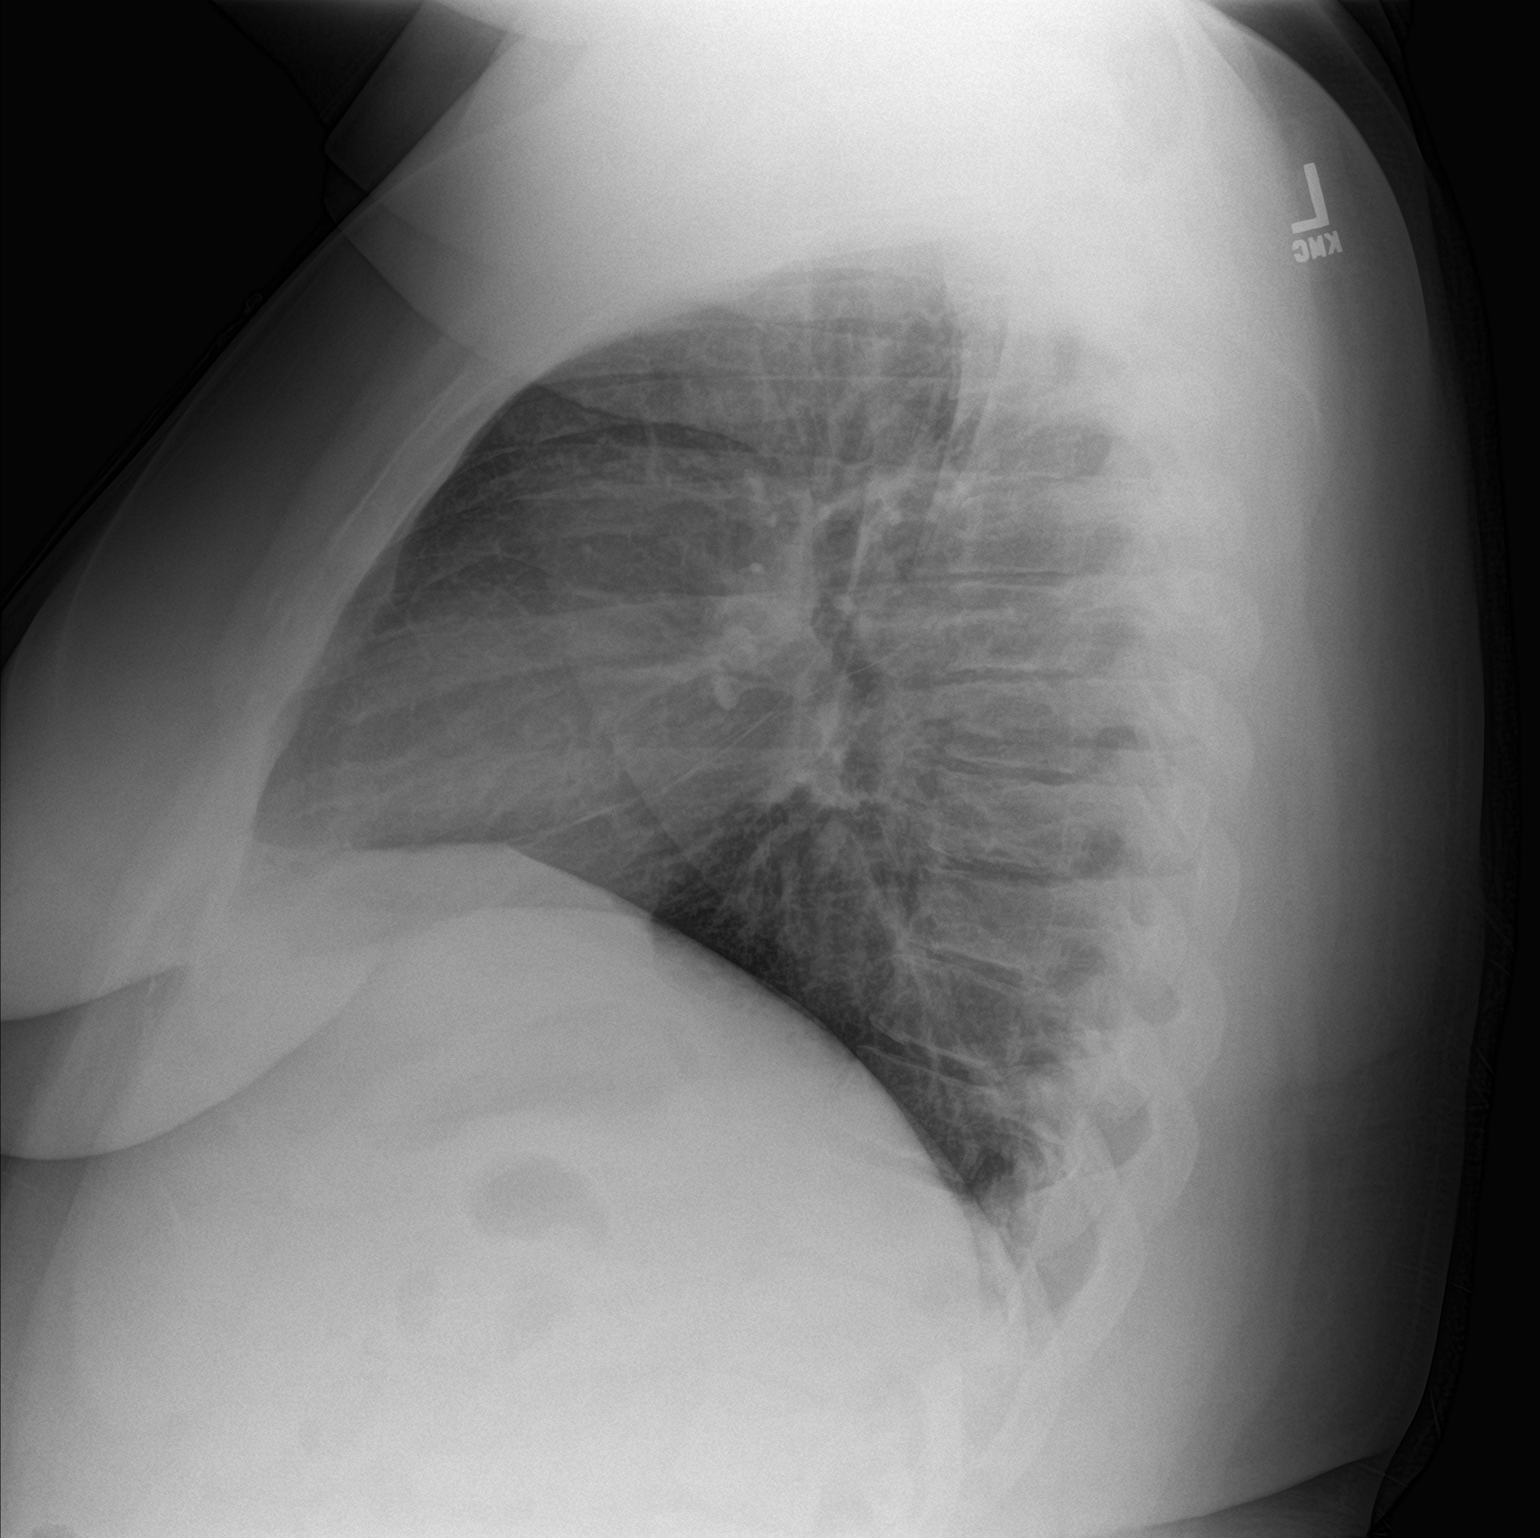

[2 of 2 positions shown; findings below may reference images not displayed]

FINDINGS: The heart size and mediastinal contours are within normal limits.
Both lungs are clear. The visualized skeletal structures are
unremarkable.
IMPRESSION: No active cardiopulmonary disease.

## 2018-05-19 ENCOUNTER — Encounter (HOSPITAL_COMMUNITY): Payer: Self-pay | Admitting: Emergency Medicine

## 2018-05-19 ENCOUNTER — Other Ambulatory Visit: Payer: Self-pay

## 2018-05-19 DIAGNOSIS — R0981 Nasal congestion: Secondary | ICD-10-CM | POA: Diagnosis not present

## 2018-05-19 DIAGNOSIS — H9209 Otalgia, unspecified ear: Secondary | ICD-10-CM | POA: Insufficient documentation

## 2018-05-19 DIAGNOSIS — Z5321 Procedure and treatment not carried out due to patient leaving prior to being seen by health care provider: Secondary | ICD-10-CM | POA: Insufficient documentation

## 2018-05-19 NOTE — ED Triage Notes (Signed)
Pt states nasal congestion, ear pain for a week. Was taking OTC medication but stopped d/t hx of HTN.

## 2018-05-20 ENCOUNTER — Emergency Department (HOSPITAL_COMMUNITY)
Admission: EM | Admit: 2018-05-20 | Discharge: 2018-05-20 | Disposition: A | Discharge status: Home / Self Care | Payer: Medicaid Other | Attending: Emergency Medicine | Admitting: Emergency Medicine

## 2018-05-20 NOTE — ED Notes (Signed)
Pt called 2x not in waiting room, dismissed LWBS

## 2018-07-01 ENCOUNTER — Encounter (HOSPITAL_COMMUNITY): Payer: Self-pay | Admitting: Emergency Medicine

## 2018-07-01 ENCOUNTER — Emergency Department (HOSPITAL_COMMUNITY)
Admission: EM | Admit: 2018-07-01 | Discharge: 2018-07-02 | Disposition: A | Discharge status: Home / Self Care | Payer: Medicaid Other | Attending: Emergency Medicine | Admitting: Emergency Medicine

## 2018-07-01 ENCOUNTER — Other Ambulatory Visit: Payer: Self-pay

## 2018-07-01 DIAGNOSIS — J45909 Unspecified asthma, uncomplicated: Secondary | ICD-10-CM | POA: Insufficient documentation

## 2018-07-01 DIAGNOSIS — R739 Hyperglycemia, unspecified: Secondary | ICD-10-CM | POA: Diagnosis present

## 2018-07-01 DIAGNOSIS — Z87891 Personal history of nicotine dependence: Secondary | ICD-10-CM | POA: Insufficient documentation

## 2018-07-01 DIAGNOSIS — I10 Essential (primary) hypertension: Secondary | ICD-10-CM | POA: Insufficient documentation

## 2018-07-01 DIAGNOSIS — Z79899 Other long term (current) drug therapy: Secondary | ICD-10-CM | POA: Diagnosis not present

## 2018-07-01 DIAGNOSIS — K611 Rectal abscess: Secondary | ICD-10-CM | POA: Diagnosis not present

## 2018-07-01 LAB — CBG MONITORING, ED: Glucose-Capillary: 350 mg/dL — ABNORMAL HIGH (ref 70–99)

## 2018-07-01 MED ORDER — DOXYCYCLINE HYCLATE 100 MG PO CAPS
100.0000 mg | ORAL_CAPSULE | Freq: Two times a day (BID) | ORAL | 0 refills | Status: DC
Start: 2018-07-01 — End: 2018-07-20

## 2018-07-01 MED ORDER — LIDOCAINE-EPINEPHRINE (PF) 2 %-1:200000 IJ SOLN
20.0000 mL | Freq: Once | INTRAMUSCULAR | Status: AC
  Administered 2018-07-01: 20 mL via INTRADERMAL
  Filled 2018-07-01: qty 20
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
## 2018-07-01 NOTE — ED Notes (Signed)
Lidocaine and tray at bedside.

## 2018-07-01 NOTE — Discharge Instructions (Signed)
Take Doxycycline for the next week You can take Tylenol or ibuprofen for pain Keep the wound clean. Do not submerge in water for 24 hours. Change bandage every day and sooner if it gets dirty Return for fever, increased redness, swelling, pain, or worsening drainage

## 2018-07-01 NOTE — ED Notes (Signed)
Swollen area on right buttocks

## 2018-07-20 ENCOUNTER — Emergency Department (HOSPITAL_COMMUNITY)
Admission: EM | Admit: 2018-07-20 | Discharge: 2018-07-20 | Disposition: A | Discharge status: Home / Self Care | Payer: Medicaid Other | Attending: Emergency Medicine | Admitting: Emergency Medicine

## 2018-07-20 ENCOUNTER — Encounter (HOSPITAL_COMMUNITY): Payer: Self-pay | Admitting: Emergency Medicine

## 2018-07-20 ENCOUNTER — Other Ambulatory Visit: Payer: Self-pay

## 2018-07-20 DIAGNOSIS — Z7984 Long term (current) use of oral hypoglycemic drugs: Secondary | ICD-10-CM | POA: Diagnosis not present

## 2018-07-20 DIAGNOSIS — J45909 Unspecified asthma, uncomplicated: Secondary | ICD-10-CM | POA: Diagnosis not present

## 2018-07-20 DIAGNOSIS — I10 Essential (primary) hypertension: Secondary | ICD-10-CM | POA: Insufficient documentation

## 2018-07-20 DIAGNOSIS — L02411 Cutaneous abscess of right axilla: Secondary | ICD-10-CM | POA: Diagnosis not present

## 2018-07-20 DIAGNOSIS — Z79899 Other long term (current) drug therapy: Secondary | ICD-10-CM | POA: Diagnosis not present

## 2018-07-20 DIAGNOSIS — Z87891 Personal history of nicotine dependence: Secondary | ICD-10-CM | POA: Insufficient documentation

## 2018-07-20 DIAGNOSIS — E119 Type 2 diabetes mellitus without complications: Secondary | ICD-10-CM | POA: Diagnosis not present

## 2018-07-20 DIAGNOSIS — R2231 Localized swelling, mass and lump, right upper limb: Secondary | ICD-10-CM | POA: Diagnosis present

## 2018-07-20 MED ORDER — DOXYCYCLINE HYCLATE 100 MG PO CAPS: 100.0000 mg | ORAL_CAPSULE | Freq: Two times a day (BID) | ORAL | 0 refills | Status: DC

## 2018-07-20 MED ORDER — DOXYCYCLINE HYCLATE 100 MG PO TABS
100.0000 mg | ORAL_TABLET | Freq: Once | ORAL | Status: AC
  Administered 2018-07-20: 100 mg via ORAL
  Filled 2018-07-20: qty 1

## 2018-07-20 MED ORDER — LIDOCAINE HCL (PF) 1 % IJ SOLN
INTRAMUSCULAR | Status: AC
  Administered 2018-07-20: 14:00:00
  Filled 2018-07-20: qty 5

## 2018-07-20 MED ORDER — ONDANSETRON HCL 4 MG PO TABS
4.0000 mg | ORAL_TABLET | Freq: Once | ORAL | Status: AC
  Administered 2018-07-20: 4 mg via ORAL
  Filled 2018-07-20: qty 1

## 2018-07-20 MED ORDER — HYDROCODONE-ACETAMINOPHEN 5-325 MG PO TABS: 1.0000 | ORAL_TABLET | ORAL | 0 refills | Status: DC | PRN

## 2018-07-20 MED ORDER — POVIDONE-IODINE 10 % EX SOLN
CUTANEOUS | Status: AC
  Administered 2018-07-20: 14:00:00
  Filled 2018-07-20: qty 15

## 2018-07-20 MED ORDER — HYDROCODONE-ACETAMINOPHEN 5-325 MG PO TABS
2.0000 | ORAL_TABLET | Freq: Once | ORAL | Status: AC
  Administered 2018-07-20: 2 via ORAL
  Filled 2018-07-20: qty 2

## 2018-07-20 NOTE — Discharge Instructions (Addendum)
Starting tomorrow, Sunday, please use warm Epson salt bath for 10 to 15 minutes daily until the wound heals from the inside out.  Change the dressing daily.  Use doxycycline 2 times daily with food.  Use Tylenol extra strength 1000 mg every 4 hours as needed for mild pain, use Norco for more severe pain.This medication may cause drowsiness. Please do not drink, drive, or participate in activity that requires concentration while taking this medication.  See your primary physician or return to the emergency department if any signs of advancing infection, problems, or concerns.

## 2018-07-20 NOTE — ED Triage Notes (Signed)
Pt reports abscess to right axilla x 3 days. Had another one about a month ago she had to get treated. Denies fevers, was using OTC cream on area.

## 2018-07-20 NOTE — ED Provider Notes (Signed)
[facility] EMERGENCY DEPARTMENT Provider Note   CSN: [id number] Arrival date & time: 07/20/18  1054     History   Chief Complaint Chief Complaint  Patient presents with  . Abscess    right axilla    HPI Ruth Campbell is a 32 y.o. female.  The history is provided by the patient.  Abscess  Location:  Torso Torso abscess location:  R axilla Abscess quality: fluctuance, painful, redness and warmth   Red streaking: no   Duration:  3 days Progression:  Worsening Pain details:    Quality:  Throbbing, pressure and shooting   Severity:  Moderate   Duration:  3 days   Timing:  Constant Chronicity:  New Context: diabetes   Relieved by:  Nothing Worsened by:  Nothing Ineffective treatments:  Warm compresses Associated symptoms: no fever, no nausea and no vomiting   Risk factors: prior abscess     Past Medical History:  Diagnosis Date  . Asthma   . Diabetes mellitus without complication (HCC)   . Hypertension   . Obesity     Patient Active Problem List   Diagnosis Date Noted  . Obesity, morbid, BMI 50 or higher (HCC) 10/31/2016  . Type 2 diabetes mellitus without complications (HCC) 10/31/2016  . Hypertension 10/31/2016  . Irregular periods 07/10/2016    History reviewed. No pertinent surgical history.   OB History    Gravida  0   Para  0   Term  0   Preterm  0   AB  0   Living  0     SAB  0   TAB  0   Ectopic  0   Multiple  0   Live Births  0            Home Medications    Prior to Admission medications   Medication Sig Start Date End Date Taking? Authorizing Provider  amLODipine (NORVASC) 10 MG tablet Take 10 mg by mouth daily. 04/03/16   [provider]  Cholecalciferol (VITAMIN D3) 50000 units CAPS Take 1 capsule by mouth once a week.  05/08/18   [provider]  doxycycline (VIBRAMYCIN) 100 MG capsule Take 1 capsule (100 mg total) by mouth 2 (two) times daily. 07/01/18   Bethel Born, PA-C  INVOKANA 100  MG TABS tablet Take 100 mg by mouth every morning.  04/03/16   [provider]  labetalol (NORMODYNE) 100 MG tablet Take 100 mg by mouth 2 (two) times daily. 04/08/18   [provider]  linagliptin (TRADJENTA) 5 MG TABS tablet Take 5 mg by mouth daily.    [provider]  metFORMIN (GLUCOPHAGE) 1000 MG tablet Take 1,000 mg by mouth 2 (two) times daily. 03/31/16   [provider]    Family History Family History  Problem Relation Age of Onset  . Cancer Paternal Grandmother        bone  . Cancer Father        prostate  . Hypertension Father   . Hypertension Mother   . Heart disease Mother     Social History Social History   Tobacco Use  . Smoking status: Former Smoker    Types: Cigarettes  . Smokeless tobacco: Never Used  Substance Use Topics  . Alcohol use: No    Frequency: Never  . Drug use: No     Allergies   Patient has no known allergies.   Review of Systems Review of Systems  Constitutional: Negative for  activity change and fever.       All ROS Neg except as noted in HPI  HENT: Negative for nosebleeds.   Eyes: Negative for photophobia and discharge.  Respiratory: Negative for cough, shortness of breath and wheezing.   Cardiovascular: Negative for chest pain and palpitations.  Gastrointestinal: Negative for abdominal pain, blood in stool, nausea and vomiting.  Genitourinary: Negative for dysuria, frequency and hematuria.  Musculoskeletal: Negative for arthralgias, back pain and neck pain.  Skin: Negative.   Neurological: Negative for dizziness, seizures and speech difficulty.  Psychiatric/Behavioral: Negative for confusion and hallucinations.     Physical Exam Updated Vital Signs BP (!) 149/92   Pulse 98   Temp 98.2 F (36.8 C)   Resp 18   Ht 5\' 8"  (1.727 m)   Wt 131.1 kg   LMP 02/03/2018   SpO2 98%   BMI 43.94 kg/m   Physical Exam  Constitutional: She is oriented to person, place, and time. She appears  well-developed and well-nourished.  Non-toxic appearance.  HENT:  Head: Normocephalic.  Right Ear: Tympanic membrane and external ear normal.  Left Ear: Tympanic membrane and external ear normal.  Eyes: Pupils are equal, round, and reactive to light. EOM and lids are normal.  Neck: Normal range of motion. Neck supple. Carotid bruit is not present.  Cardiovascular: Normal rate, regular rhythm, normal heart sounds, intact distal pulses and normal pulses.  Pulmonary/Chest: Breath sounds normal. No respiratory distress.  Abdominal: Soft. Bowel sounds are normal. There is no tenderness. There is no guarding.  Musculoskeletal: Normal range of motion.  Lymphadenopathy:       Head (right side): No submandibular adenopathy present.       Head (left side): No submandibular adenopathy present.    She has no cervical adenopathy.  Neurological: She is alert and oriented to person, place, and time. She has normal strength. No cranial nerve deficit or sensory deficit.  Skin: Skin is warm and dry.  Abscess left axilla.  No drainage, no red streaking noted.  Psychiatric: She has a normal mood and affect. Her speech is normal.  Nursing note and vitals reviewed.    ED Treatments / Results  Labs (all labs ordered are listed, but only abnormal results are displayed) Labs Reviewed - No data to display  EKG None  Radiology No results found.  Procedures .Marland Kitchen.Incision and Drainage Date/Time: 07/20/2018 2:10 PM Performed by: Ivery QualeBryant, Zakirah Weingart, PA-C Authorized by: Ivery QualeBryant, Vieva Brummitt, PA-C   Consent:    Consent obtained:  Verbal   Consent given by:  Patient   Risks discussed:  Incomplete drainage, infection and pain Location:    Type:  Abscess   Location:  Trunk (right axilla) Pre-procedure details:    Skin preparation:  Betadine Anesthesia (see MAR for exact dosages):    Anesthesia method:  Local infiltration   Local anesthetic:  Lidocaine 1% w/o epi Procedure type:    Complexity:  Simple Procedure  details:    Incision types:  Single straight   Incision depth:  Subcutaneous   Scalpel blade:  11   Wound management:  Probed and deloculated and irrigated with saline   Drainage:  Bloody and purulent   Drainage amount:  Copious   Wound treatment:  Wound left open   Packing materials:  None Post-procedure details:    Patient tolerance of procedure:  Tolerated well, no immediate complications   (including critical care time)  Medications Ordered in ED Medications  povidone-iodine (BETADINE) 10 % external solution (  Given  by Other 07/20/18 1348)  lidocaine (PF) (XYLOCAINE) 1 % injection (  Given by Other 07/20/18 1349)  lidocaine (PF) (XYLOCAINE) 1 % injection (  Given by Other 07/20/18 1349)     Initial Impression / Assessment and Plan / ED Course  I have reviewed the triage vital signs and the nursing notes.  Pertinent labs & imaging results that were available during my care of the patient were reviewed by me and considered in my medical decision making (see chart for details).       Final Clinical Impressions(s) / ED Diagnoses MDm  Blood pressure is elevated at 147/106.  I have asked the patient to have this rechecked soon.  Patient has a history of previous abscess areas.  Patient has a history of diabetes mellitus.  Abscess of the right axilla incised and drained.  Culture sent to the lab.  Patient tolerated the procedure without problem.  Prescription for doxycycline given to the patient.  Patient also given medication for pain.  Patient is to follow-up with her primary physician or return to the emergency department if any changes in condition, problems, or concerns.   Final diagnoses:  Abscess of axilla, right    ED Discharge Orders         Ordered    HYDROcodone-acetaminophen (NORCO/VICODIN) 5-325 MG tablet  Every 4 hours PRN     07/20/18 1407    doxycycline (VIBRAMYCIN) 100 MG capsule  2 times daily     07/20/18 1407           Ivery Quale,  PA-C 07/20/18 2222    Terrilee Files, MD 07/21/18 1718

## 2018-07-23 LAB — AEROBIC CULTURE  (SUPERFICIAL SPECIMEN): SPECIAL REQUESTS: NORMAL

## 2018-07-23 LAB — AEROBIC CULTURE W GRAM STAIN (SUPERFICIAL SPECIMEN)

## 2018-07-24 ENCOUNTER — Telehealth: Payer: Self-pay | Admitting: *Deleted

## 2018-07-24 NOTE — Progress Notes (Signed)
ED Antimicrobial Stewardship Positive Culture Follow Up   [name] is an 32 y.o. female who [facility] with a chief complaint of  Chief Complaint  Patient presents with  . Abscess    right axilla    Recent Results (from the past 720 hour(s))  Wound or Superficial Culture     Status: None   Collection Time: 07/20/18  2:04 PM  Result Value Ref Range Status   Specimen Description   Final    ABSCESS Performed at [facility], [address] [place], [place] [address]    Special Requests   Final    Normal Performed at [facility], [address] [place], [place] [address]    Gram Stain   Final    MODERATE WBC PRESENT, PREDOMINANTLY PMN ABUNDANT GRAM POSITIVE COCCI IN PAIRS IN CHAINS FEW GRAM POSITIVE RODS FEW GRAM NEGATIVE RODS Performed at [facility], [address] [place], [place] [address]    Culture ABUNDANT PROTEUS MIRABILIS  Final   Report Status 07/23/2018 FINAL  Final   Organism ID, Bacteria PROTEUS MIRABILIS  Final      Susceptibility   Proteus mirabilis - MIC*    AMPICILLIN <=2 SENSITIVE Sensitive     CEFAZOLIN <=4 SENSITIVE Sensitive     CEFEPIME <=1 SENSITIVE Sensitive     CEFTAZIDIME <=1 SENSITIVE Sensitive     CEFTRIAXONE <=1 SENSITIVE Sensitive     CIPROFLOXACIN <=0.25 SENSITIVE Sensitive     GENTAMICIN <=1 SENSITIVE Sensitive     IMIPENEM 2 SENSITIVE Sensitive     TRIMETH/SULFA <=20 SENSITIVE Sensitive     AMPICILLIN/SULBACTAM <=2 SENSITIVE Sensitive     PIP/TAZO <=4 SENSITIVE Sensitive     * ABUNDANT PROTEUS MIRABILIS    Abscess I&D in the ED and discharged on doxycycline for 7 days.  Call pt to assess, if improvement and no redness stop antibiotic, if persisting start Keflex 500mg  BID x 7 days.  ED Provider: Burna FortsJeff Hedges - PA-C   Ruth PoseyJonathan  [name] 07/24/2018, 10:20 AM Clinical Pharmacist Monday - Friday phone -  [phone] Saturday - Sunday phone - [phone]

## 2018-07-24 NOTE — Telephone Encounter (Signed)
Post ED Visit - Positive Culture Follow-up  Culture report reviewed by antimicrobial stewardship pharmacist:  []  Enzo BiNathan Batchelder, Pharm.D. []  Celedonio MiyamotoJeremy Frens, Pharm.D., BCPS AQ-ID []  Garvin FilaMike Maccia, Pharm.D., BCPS []  Georgina PillionElizabeth Martin, [address] BCPS []  [place] HillsMinh Pham, [address]Ph[address]D., BCPS, AAHIVP []  Estella HuskMichelle Turner, Pharm.D., BCPS, AAHIVP []  Lysle Pearlachel Rumbarger, PharmD, BCPS []  Phillips Climeshuy Dang, PharmD, BCPS []  Agapito GamesAlison Masters, PharmD, BCPS []  Verlan FriendsErin Deja, PharmD Daylene PoseyJonathan, Oriet, PharmD  Positive wound culture, reviewed by Burna FortsJeff Hedges, PA-C  Treated with Doxycycline Hyclate.  Spoke with patient states wound is much better and healing well.  Advised to continue antibiotic therapy for complete course.    Virl AxeRobertson, Brealynn Contino [facility] 07/24/2018, 1:10 PM

## 2018-09-25 ENCOUNTER — Encounter: Payer: Self-pay | Admitting: "Endocrinology

## 2018-09-25 ENCOUNTER — Ambulatory Visit (INDEPENDENT_AMBULATORY_CARE_PROVIDER_SITE_OTHER): Payer: Medicaid Other | Admitting: "Endocrinology

## 2018-09-25 VITALS — BP 152/94 | HR 92 | Ht 69.5 in | Wt 328.0 lb

## 2018-09-25 DIAGNOSIS — I10 Essential (primary) hypertension: Secondary | ICD-10-CM | POA: Diagnosis not present

## 2018-09-25 DIAGNOSIS — E119 Type 2 diabetes mellitus without complications: Secondary | ICD-10-CM

## 2018-09-25 MED ORDER — ACCU-CHEK GUIDE ME W/DEVICE KIT: 1.0000 | PACK | 0 refills | Status: DC

## 2018-09-25 MED ORDER — GLUCOSE BLOOD VI STRP: ORAL_STRIP | 2 refills | Status: DC

## 2018-09-25 NOTE — Progress Notes (Signed)
Endocrinology Consult Note       09/25/2018, 3:37 PM   Subjective:    Patient ID: Ruth Campbell, female    DOB: [birth year].  Ruth Campbell is being seen in consultation for management of currently uncontrolled symptomatic diabetes requested by  [facility].   Past Medical History:  Diagnosis Date  . Asthma   . Diabetes mellitus without complication ([place])   . Hypertension   . Obesity    History reviewed. No pertinent surgical history. Social History   Socioeconomic History  . Marital status: Single    Spouse name: Not on file  . Number of children: Not on file  . Years of education: Not on file  . Highest education level: Not on file  Occupational History  . Not on file  Social Needs  . Financial resource strain: Not on file  . Food insecurity:    Worry: Not on file    Inability: Not on file  . Transportation needs:    Medical: Not on file    Non-medical: Not on file  Tobacco Use  . Smoking status: Never Smoker  . Smokeless tobacco: Never Used  Substance and Sexual Activity  . Alcohol use: No    Frequency: Never  . Drug use: Not Currently    Types: Marijuana    Comment: last used 2014   . Sexual activity: Yes    Birth control/protection: None  Lifestyle  . Physical activity:    Days per week: Not on file    Minutes per session: Not on file  . Stress: Not on file  Relationships  . Social connections:    Talks on phone: Not on file    Gets together: Not on file    Attends religious service: Not on file    Active member of club or organization: Not on file    Attends meetings of clubs or organizations: Not on file    Relationship status: Not on file  Other Topics Concern  . Not on file  Social History Narrative  . Not on file   Outpatient Encounter Medications as of 09/25/2018  Medication Sig  . Cholecalciferol (VITAMIN D3) 50000 units CAPS  Take 1 capsule by mouth once a week.   . labetalol (NORMODYNE) 100 MG tablet Take 100 mg by mouth 2 (two) times daily.  Marland Kitchen linagliptin (TRADJENTA) 5 MG TABS tablet Take 5 mg by mouth daily.  . metFORMIN (GLUCOPHAGE) 1000 MG tablet Take 1,000 mg by mouth 2 (two) times daily with a meal.  . metFORMIN (GLUCOPHAGE) 500 MG tablet Take 500 mg by mouth at bedtime.   . [DISCONTINUED] INVOKANA 100 MG TABS tablet Take 100 mg by mouth every morning.   . Blood Glucose Monitoring Suppl (ACCU-CHEK GUIDE ME) w/Device KIT 1 Piece by Does not apply route as directed.  Marland Kitchen glucose blood (ACCU-CHEK GUIDE) test strip Use as instructed  . [DISCONTINUED] amLODipine (NORVASC) 10 MG tablet Take 10 mg by mouth daily.  . [DISCONTINUED] doxycycline (VIBRAMYCIN) 100 MG capsule Take 1 capsule (100 mg total) by mouth 2 (two) times daily.  . [DISCONTINUED] HYDROcodone-acetaminophen (NORCO/VICODIN) 5-325  MG tablet Take 1-2 tablets by mouth every 4 (four) hours as needed.   No facility-administered encounter medications on file as of 09/25/2018.     ALLERGIES: No Known Allergies  VACCINATION STATUS: Immunization History  Administered Date(s) Administered  . Tdap 03/18/2014    Diabetes  She presents for her initial diabetic visit. She has type 2 diabetes mellitus. Onset time: She was diagnosed at approximate age of 35 years. Her disease course has been worsening. There are no hypoglycemic associated symptoms. Pertinent negatives for hypoglycemia include no confusion, headaches, pallor or seizures. Associated symptoms include fatigue, polydipsia and polyuria. Pertinent negatives for diabetes include no chest pain and no polyphagia. There are no hypoglycemic complications. Symptoms are worsening. There are no diabetic complications. Risk factors for coronary artery disease include diabetes mellitus, hypertension, family history, obesity and sedentary lifestyle. Her weight is increasing steadily. She is following a generally  unhealthy diet. When asked about meal planning, she reported none. She has not had a previous visit with a dietitian. She never participates in exercise. (She did not bring any meter nor logs to review.  Her most recent A1c was reported to be 11.7%.) An ACE inhibitor/angiotensin II receptor blocker is not being taken. She does not see a podiatrist.Eye exam is not current.  Hypertension  This is a chronic problem. The current episode started more than 1 year ago. The problem is uncontrolled. Pertinent negatives include no chest pain, headaches, palpitations or shortness of breath. Risk factors for coronary artery disease include diabetes mellitus, obesity, sedentary lifestyle and family history. Past treatments include beta blockers.      Review of Systems  Constitutional: Positive for fatigue. Negative for chills, fever and unexpected weight change.  HENT: Negative for trouble swallowing and voice change.   Eyes: Negative for visual disturbance.  Respiratory: Negative for cough, shortness of breath and wheezing.   Cardiovascular: Negative for chest pain, palpitations and leg swelling.  Gastrointestinal: Negative for diarrhea, nausea and vomiting.  Endocrine: Positive for polydipsia and polyuria. Negative for cold intolerance, heat intolerance and polyphagia.  Musculoskeletal: Negative for arthralgias and myalgias.  Skin: Negative for color change, pallor, rash and wound.  Neurological: Negative for seizures and headaches.  Psychiatric/Behavioral: Negative for confusion and suicidal ideas.    Objective:    BP (!) 152/94   Pulse 92   Ht 5' 9.5" (1.765 m)   Wt (!) 328 lb (148.8 kg)   BMI 47.74 kg/m   Wt Readings from Last 3 Encounters:  09/25/18 (!) 328 lb (148.8 kg)  07/20/18 289 lb (131.1 kg)  07/01/18 289 lb (131.1 kg)     Physical Exam Constitutional:      Appearance: She is well-developed.  HENT:     Head: Normocephalic and atraumatic.  Neck:     Musculoskeletal: Normal  range of motion and neck supple.     Thyroid: No thyromegaly.     Trachea: No tracheal deviation.  Cardiovascular:     Rate and Rhythm: Normal rate and regular rhythm.  Pulmonary:     Effort: Pulmonary effort is normal.     Breath sounds: Normal breath sounds.  Abdominal:     General: Bowel sounds are normal.     Palpations: Abdomen is soft.     Tenderness: There is no abdominal tenderness. There is no guarding.  Musculoskeletal: Normal range of motion.  Skin:    General: Skin is warm and dry.     Coloration: Skin is not pale.  Findings: No erythema or rash.  Neurological:     Mental Status: She is alert and oriented to person, place, and time.     Cranial Nerves: No cranial nerve deficit.     Coordination: Coordination normal.     Deep Tendon Reflexes: Reflexes are normal and symmetric.  Psychiatric:        Judgment: Judgment normal.      CMP ( most recent) CMP     Component Value Date/Time   NA 140 04/18/2014 1808   K 3.8 04/18/2014 1808   CL 101 04/18/2014 1808   CO2 28 04/18/2014 1808   GLUCOSE 198 (H) 04/18/2014 1808   BUN 12 01/23/2018   CREATININE 0.8 01/23/2018   CREATININE 0.82 04/18/2014 1808   CALCIUM 9.2 04/18/2014 1808   PROT 6.4 04/18/2014 1808   ALBUMIN 3.8 04/18/2014 1808   AST 46 (H) 04/18/2014 1808   ALT 62 (H) 04/18/2014 1808   ALKPHOS 96 04/18/2014 1808   BILITOT 1.1 04/18/2014 1808   GFRNONAA >90 04/18/2014 1808   GFRAA >90 04/18/2014 1808   In May 2019 BUN 12/creatinine 0.83 A1c 11.7%.   Assessment & Plan:   1. Type 2 diabetes mellitus without complication, without long-term current use of insulin (HCC)  - Ruth Campbell has currently uncontrolled symptomatic type 2 DM since 33 years of age,  with most recent A1c of 11.7 %.  Has no recent labs to review, will be sent for new set of labs today.   - I had a long discussion with her about the progressive nature of diabetes and the pathology behind its complications. -her diabetes is  complicated by obesity/sedentary life and she remains at a high risk for more acute and chronic complications which include CAD, CVA, CKD, retinopathy, and neuropathy. These are all discussed in detail with her.  - I have counseled her on diet management and weight loss, by adopting a carbohydrate restricted/protein rich diet. - she admits that there is a room for improvement in her food and drink choices. - Suggestion is made for her to avoid simple carbohydrates  from her diet including Cakes, Sweet Desserts, Ice Cream, Soda (diet and regular), Sweet Tea, Candies, Chips, Cookies, Store Bought Juices, Alcohol in Excess of  1-2 drinks a day, Artificial Sweeteners,  Coffee Creamer, and "Sugar-free" Products. This will help patient to have more stable blood glucose profile and potentially avoid unintended weight gain.  - I encouraged her to switch to  unprocessed or minimally processed complex starch and increased protein intake (animal or plant source), fruits, and vegetables.  - she is advised to stick to a routine mealtimes to eat 3 meals  a day and avoid unnecessary snacks ( to snack only to correct hypoglycemia).   - she will be scheduled with Jearld Fenton, RDN, CDE for individualized diabetes education.  - I have approached her with the following individualized plan to manage diabetes and patient agrees:   -Given her current and prevailing glycemic burden, she may require insulin treatment in order for her to achieve and maintain control of diabetes to target. -In preparation, I approach her to start strict monitoring of blood glucose  4 times a day-before meals and at bedtime and return in 1 week with her meter and logs for evaluation.  I will prescribe a new meter and testing supplies for her.  - she is encouraged to call clinic for blood glucose levels less than 70 or above 300 mg /dl. - she is advised to  continue metformin 1000 mg p.o. twice daily, Tradjenta 5 mg p.o. daily,  therapeutically suitable for patient . - her Anastasio Auerbach will be discontinued, risk outweighs benefit for this patient.   - Patient specific target  A1c;  LDL, HDL, Triglycerides, and  Waist Circumference were discussed in detail.  2) Blood Pressure /Hypertension:  her blood pressure is not controlled to target.   she is advised to continue her current medications including labetalol 200 mg p.o. daily 2 times daily.    3) Lipids/Hyperlipidemia: No recent lipid panel to review.  She is not on statins.  She will be considered for fasting lipid panel on subsequent visits.    4)  Weight/Diet:  Body mass index is 47.74 kg/m.  -  clearly complicating her diabetes care.  I discussed with her the fact that loss of 5 - 10% of her  current body weight will have the most impact on her diabetes management.  CDE Consult will be initiated . Exercise, and detailed carbohydrates information provided  -  detailed on discharge instructions.  5) Chronic Care/Health Maintenance:  -she  Is not on ACEI/ARB and Statin medications and  is encouraged to initiate and continue to follow up with Ophthalmology, Dentist,  Podiatrist at least yearly or according to recommendations, and advised to  stay away from smoking. I have recommended yearly flu vaccine and pneumonia vaccine at least every 5 years; moderate intensity exercise for up to 150 minutes weekly; and  sleep for at least 7 hours a day.  - she is  advised to maintain close follow up with [facility] [place] for primary care needs, as well as her other providers for optimal and coordinated care.  - Time spent with the patient: 35 minutes, of which >50% was spent in obtaining information about her symptoms, reviewing her previous labs/studies, evaluations, and treatments, counseling her about her currently uncontrolled type 2 diabetes, hypertension, obesity/sedentary life, and developing plans for long term treatment based on the latest standards  of care/guidelines.    Ruth Campbell participated in the discussions, expressed understanding, and voiced agreement with the above plans.  All questions were answered to her satisfaction. she is encouraged to contact clinic should she have any questions or concerns prior to her return visit.  Follow up plan: - Return in about 1 week (around 10/02/2018) for Labs Today- Non-Fasting Ok.  Glade Lloyd, MD [facility] [address] [place], [place] [address] Phone: [phone]  Fax: [phone]    09/25/2018, 3:37 PM  This note was partially dictated with voice recognition software. Similar sounding words can be transcribed inadequately or may not  be corrected upon review.

## 2018-09-25 NOTE — Patient Instructions (Signed)

## 2018-09-26 ENCOUNTER — Other Ambulatory Visit: Payer: Self-pay

## 2018-09-26 LAB — T4, FREE: FREE T4: 1.3 ng/dL (ref 0.8–1.8)

## 2018-09-26 LAB — MICROALBUMIN / CREATININE URINE RATIO
CREATININE, URINE: 47 mg/dL (ref 20–275)
MICROALB UR: 1.3 mg/dL
Microalb Creat Ratio: 28 mcg/mg creat (ref <30)

## 2018-09-26 LAB — HEMOGLOBIN A1C
HEMOGLOBIN A1C: 10.3 %{Hb} — AB (ref <5.7)
MEAN PLASMA GLUCOSE: 249 (calc)
eAG (mmol/L): 13.8 (calc)

## 2018-09-26 LAB — COMPLETE METABOLIC PANEL WITH GFR
AG Ratio: 1.6 (calc) (ref 1.0–2.5)
ALBUMIN MSPROF: 4.5 g/dL (ref 3.6–5.1)
ALKALINE PHOSPHATASE (APISO): 131 U/L — AB (ref 33–115)
ALT: 38 U/L — AB (ref 6–29)
AST: 21 U/L (ref 10–30)
BUN: 12 mg/dL (ref 7–25)
CO2: 23 mmol/L (ref 20–32)
CREATININE: 0.84 mg/dL (ref 0.50–1.10)
Calcium: 10 mg/dL (ref 8.6–10.2)
Chloride: 103 mmol/L (ref 98–110)
GFR, EST AFRICAN AMERICAN: 108 mL/min/{1.73_m2} (ref >=60)
GFR, EST NON AFRICAN AMERICAN: 93 mL/min/{1.73_m2} (ref >=60)
Globulin: 2.8 g/dL (calc) (ref 1.9–3.7)
Glucose, Bld: 307 mg/dL — ABNORMAL HIGH (ref 65–99)
Potassium: 4.1 mmol/L (ref 3.5–5.3)
Sodium: 139 mmol/L (ref 135–146)
Total Bilirubin: 0.4 mg/dL (ref 0.2–1.2)
Total Protein: 7.3 g/dL (ref 6.1–8.1)

## 2018-09-26 LAB — VITAMIN D 25 HYDROXY (VIT D DEFICIENCY, FRACTURES): Vit D, 25-Hydroxy: 35 ng/mL (ref 30–100)

## 2018-09-26 LAB — TSH: TSH: 3.22 mIU/L

## 2018-09-26 MED ORDER — ACCU-CHEK GUIDE ME W/DEVICE KIT: 1.0000 | PACK | Freq: Four times a day (QID) | 0 refills | Status: DC

## 2018-09-26 MED ORDER — GLUCOSE BLOOD VI STRP: ORAL_STRIP | 2 refills | Status: DC

## 2018-10-03 ENCOUNTER — Ambulatory Visit: Payer: Medicaid Other | Admitting: "Endocrinology

## 2018-10-17 ENCOUNTER — Ambulatory Visit: Payer: Medicaid Other | Admitting: Nutrition

## 2018-11-04 ENCOUNTER — Encounter: Payer: Self-pay | Admitting: "Endocrinology

## 2018-11-04 ENCOUNTER — Ambulatory Visit (INDEPENDENT_AMBULATORY_CARE_PROVIDER_SITE_OTHER): Payer: Medicaid Other | Admitting: "Endocrinology

## 2018-11-04 ENCOUNTER — Encounter: Payer: Medicaid Other | Attending: "Endocrinology | Admitting: Nutrition

## 2018-11-04 ENCOUNTER — Encounter: Payer: Self-pay | Admitting: Nutrition

## 2018-11-04 VITALS — BP 120/90 | HR 98 | Ht 69.5 in | Wt 325.0 lb

## 2018-11-04 DIAGNOSIS — E118 Type 2 diabetes mellitus with unspecified complications: Secondary | ICD-10-CM | POA: Diagnosis present

## 2018-11-04 DIAGNOSIS — E1165 Type 2 diabetes mellitus with hyperglycemia: Secondary | ICD-10-CM | POA: Diagnosis present

## 2018-11-04 DIAGNOSIS — IMO0002 Reserved for concepts with insufficient information to code with codable children: Secondary | ICD-10-CM

## 2018-11-04 DIAGNOSIS — E119 Type 2 diabetes mellitus without complications: Secondary | ICD-10-CM | POA: Diagnosis not present

## 2018-11-04 DIAGNOSIS — I10 Essential (primary) hypertension: Secondary | ICD-10-CM | POA: Diagnosis not present

## 2018-11-04 NOTE — Progress Notes (Signed)
Medical Nutrition Therapy:  Appt start time: 1500 end time:  1600.   Assessment:  Primary concerns today: Diabetes Type 2. A1C 10.3%. DM has been poorly controlled for the 2 yrs she has had it. Admits to problems with sweets and junk food.  LIves with her dad. Here with her fiance,  Damien Hilderbrand. Had DM for 2 yrs. Just started seeing Dr. Fransico Him, Endocrinology. Takes Metformin 1000 mg BID and Tragenta FBS 200-300's. BS 250-300's.  She has gained about 35 lbs in the last three months. Has been taken off of Invokana as risks outweigh the benefit.  Eats 2-3 meals per day. Drinks soda, juice, sweet tea. Eats 2-3 meals per day. Sleeps in during the day and stays up at night. Not working. Not exercising.. Willing to make changes with diet and exercise to improve her DM and lose weight. She is willing to make changes with her diet.  Diet exceeds her current  Needs contributing to obesity and high blood sugars. She is at high risk for complications.  Lab Results  Component Value Date   HGBA1C 10.3 (H) 09/25/2018   CMP Latest Ref Rng & Units 09/25/2018 01/23/2018 04/18/2014  Glucose 65 - 99 mg/dL 992(E) - 268(T)  BUN 7 - 25 mg/dL 12 12 10   Creatinine 0.50 - 1.10 mg/dL 4.19 0.8 6.22  Sodium 297 - 146 mmol/L 139 - 140  Potassium 3.5 - 5.3 mmol/L 4.1 - 3.8  Chloride 98 - 110 mmol/L 103 - 101  CO2 20 - 32 mmol/L 23 - 28  Calcium 8.6 - 10.2 mg/dL 98.9 - 9.2  Total Protein 6.1 - 8.1 g/dL 7.3 - 6.4  Total Bilirubin 0.2 - 1.2 mg/dL 0.4 - 1.1  Alkaline Phos 39 - 117 U/L - - 96  AST 10 - 30 U/L 21 - 46(H)  ALT 6 - 29 U/L 38(H) - 62(H)    Preferred Learning Style:     Contemplating  MEDICATIONS:    DIETARY INTAKE:   24-hr recall:  B ( AM):  skipped Snk ( AM):   L ( PM): BIgmac, Sweet tea 1/2,   Snk ( PM): chips D ( PM):  Hot dogs 2 with buns, chili,mustard and onions, Lemonade Snk ( PM):  Beverages: tea or lemonade or juice.   Usual physical activity: ADL  Estimated energy  needs: 1400  calories 158 g carbohydrates 105 g protein 39 g fat  Progress Towards Goal(s):  In progress.   Nutritional Diagnosis:  NB-1.1 Food and nutrition-related knowledge deficit As related to Diabetes Type 2, .  As evidenced by A1C 10.4%.    Intervention: Nutrition and Diabetes education provided on My Plate, CHO counting, meal planning, portion sizes, timing of meals, avoiding snacks between meals unless having a low blood sugar, target ranges for A1C and blood sugars, signs/symptoms and treatment of hyper/hypoglycemia, monitoring blood sugars, taking medications as prescribed, benefits of exercising 30 minutes per day and prevention of complications of DM. Marland KitchenGoals 1. Eat three meals per day; don't skip meals 2. Cut out snacks and junk food and sodas 3. Drink only water Exercise 60 minutes 4 times per day. Increase fresh fruits and vegetables. Test blood sugar 4 times per day. Get A1C less than 8%  Teaching Method Utilized:  Visual Auditory Hands on  Handouts given during visit include:  The Plate Method   Meal Plan Card  Diabetes Instructions.   Barriers to learning/adherence to lifestyle change: none  Demonstrated degree of understanding via:  Teach Back  Monitoring/Evaluation:  Dietary intake, exercise, meal planning , and body weight in 2 weeks.

## 2018-11-04 NOTE — Patient Instructions (Signed)
Goals 1. Eat three meals per day; don't skip meals 2. Cut out snacks and junk food and sodas 3. Drink only water Exercise 60 minutes 4 times per day. Increase fresh fruits and vegetables. Test blood sugar 4 times per day. Get A1C less than 8%

## 2018-11-04 NOTE — Patient Instructions (Signed)

## 2018-11-04 NOTE — Progress Notes (Signed)
Endocrinology follow up Note       11/04/2018, 3:09 PM   Subjective:    Patient ID: Ruth Campbell, female    DOB: [birth year].  Ruth Campbell is being seen in follow up for management of currently uncontrolled symptomatic diabetes requested by  [facility].   Past Medical History:  Diagnosis Date  . Asthma   . Diabetes mellitus without complication ([place])   . Hypertension   . Obesity    History reviewed. No pertinent surgical history. Social History   Socioeconomic History  . Marital status: Single    Spouse name: Not on file  . Number of children: Not on file  . Years of education: Not on file  . Highest education level: Not on file  Occupational History  . Not on file  Social Needs  . Financial resource strain: Not on file  . Food insecurity:    Worry: Not on file    Inability: Not on file  . Transportation needs:    Medical: Not on file    Non-medical: Not on file  Tobacco Use  . Smoking status: Never Smoker  . Smokeless tobacco: Never Used  Substance and Sexual Activity  . Alcohol use: No    Frequency: Never  . Drug use: Not Currently    Types: Marijuana    Comment: last used 2014   . Sexual activity: Yes    Birth control/protection: None  Lifestyle  . Physical activity:    Days per week: Not on file    Minutes per session: Not on file  . Stress: Not on file  Relationships  . Social connections:    Talks on phone: Not on file    Gets together: Not on file    Attends religious service: Not on file    Active member of club or organization: Not on file    Attends meetings of clubs or organizations: Not on file    Relationship status: Not on file  Other Topics Concern  . Not on file  Social History Narrative  . Not on file   Outpatient Encounter Medications as of 11/04/2018  Medication Sig  . Blood Glucose Monitoring Suppl (ACCU-CHEK GUIDE  ME) w/Device KIT 1 Piece by Does not apply route 4 (four) times daily.  . Cholecalciferol (VITAMIN D3) 50000 units CAPS Take 1 capsule by mouth once a week.   Marland Kitchen glucose blood (ACCU-CHEK GUIDE) test strip Use as instructed 4 x daily. E11.65  . labetalol (NORMODYNE) 100 MG tablet Take 100 mg by mouth 2 (two) times daily.  Marland Kitchen linagliptin (TRADJENTA) 5 MG TABS tablet Take 5 mg by mouth daily.  . metFORMIN (GLUCOPHAGE) 1000 MG tablet Take 1,000 mg by mouth 2 (two) times daily with a meal.  . metFORMIN (GLUCOPHAGE) 500 MG tablet Take 500 mg by mouth at bedtime.    No facility-administered encounter medications on file as of 11/04/2018.     ALLERGIES: No Known Allergies  VACCINATION STATUS: Immunization History  Administered Date(s) Administered  . Tdap 03/18/2014    Diabetes  She presents for her follow-up diabetic visit. She has type 2  diabetes mellitus. Onset time: She was diagnosed at approximate age of 70 years. Her disease course has been worsening. There are no hypoglycemic associated symptoms. Pertinent negatives for hypoglycemia include no confusion, headaches, pallor or seizures. Associated symptoms include fatigue, polydipsia and polyuria. Pertinent negatives for diabetes include no chest pain and no polyphagia. There are no hypoglycemic complications. Symptoms are worsening. There are no diabetic complications. Risk factors for coronary artery disease include diabetes mellitus, hypertension, family history, obesity and sedentary lifestyle. Her weight is fluctuating minimally. She is following a generally unhealthy diet. When asked about meal planning, she reported none. She has not had a previous visit with a dietitian. She never participates in exercise. (She brings in a log showing suspicious blood glucose readings, did not bring her meter for review.  Her recent A1c was high at 11.7%.  ) An ACE inhibitor/angiotensin II receptor blocker is not being taken. She does not see a podiatrist.Eye  exam is not current.  Hypertension  This is a chronic problem. The current episode started more than 1 year ago. The problem is uncontrolled. Pertinent negatives include no chest pain, headaches, palpitations or shortness of breath. Risk factors for coronary artery disease include diabetes mellitus, obesity, sedentary lifestyle and family history. Past treatments include beta blockers.    Review of Systems  Constitutional: Positive for fatigue. Negative for chills, fever and unexpected weight change.  HENT: Negative for trouble swallowing and voice change.   Eyes: Negative for visual disturbance.  Respiratory: Negative for cough, shortness of breath and wheezing.   Cardiovascular: Negative for chest pain, palpitations and leg swelling.  Gastrointestinal: Negative for diarrhea, nausea and vomiting.  Endocrine: Positive for polydipsia and polyuria. Negative for cold intolerance, heat intolerance and polyphagia.  Musculoskeletal: Negative for arthralgias and myalgias.  Skin: Negative for color change, pallor, rash and wound.  Neurological: Negative for seizures and headaches.  Psychiatric/Behavioral: Negative for confusion and suicidal ideas.    Objective:    BP 120/90   Pulse 98   Ht 5' 9.5" (1.765 m)   Wt (!) 325 lb (147.4 kg)   BMI 47.31 kg/m   Wt Readings from Last 3 Encounters:  11/04/18 (!) 325 lb (147.4 kg)  09/25/18 (!) 328 lb (148.8 kg)  07/20/18 289 lb (131.1 kg)     Physical Exam Constitutional:      Appearance: She is well-developed.  HENT:     Head: Normocephalic and atraumatic.  Neck:     Musculoskeletal: Normal range of motion and neck supple.     Thyroid: No thyromegaly.     Trachea: No tracheal deviation.  Cardiovascular:     Rate and Rhythm: Normal rate and regular rhythm.  Pulmonary:     Effort: Pulmonary effort is normal.     Breath sounds: Normal breath sounds.  Abdominal:     General: Bowel sounds are normal.     Palpations: Abdomen is soft.      Tenderness: There is no abdominal tenderness. There is no guarding.  Musculoskeletal: Normal range of motion.  Skin:    General: Skin is warm and dry.     Coloration: Skin is not pale.     Findings: No erythema or rash.  Neurological:     Mental Status: She is alert and oriented to person, place, and time.     Cranial Nerves: No cranial nerve deficit.     Coordination: Coordination normal.     Deep Tendon Reflexes: Reflexes are normal and symmetric.  Psychiatric:  Judgment: Judgment normal.     CMP     Component Value Date/Time   NA 139 09/25/2018 1507   K 4.1 09/25/2018 1507   CL 103 09/25/2018 1507   CO2 23 09/25/2018 1507   GLUCOSE 307 (H) 09/25/2018 1507   BUN 12 09/25/2018 1507   BUN 12 01/23/2018   CREATININE 0.84 09/25/2018 1507   CALCIUM 10.0 09/25/2018 1507   PROT 7.3 09/25/2018 1507   ALBUMIN 3.8 04/18/2014 1808   AST 21 09/25/2018 1507   ALT 38 (H) 09/25/2018 1507   ALKPHOS 96 04/18/2014 1808   BILITOT 0.4 09/25/2018 1507   GFRNONAA 93 09/25/2018 1507   GFRAA 108 09/25/2018 1507   In May 2019 BUN 12/creatinine 0.83 A1c 11.7%.   Assessment & Plan:   1. Type 2 diabetes mellitus without complication, without long-term current use of insulin (HCC)  - Ruth Campbell has currently uncontrolled symptomatic type 2 DM since 33 years of age. -Her most recent A1c was 11.7%.  She presents with a log filled with suspicious blood glucose readings, did not bring her meter to verify.     -  Once again, I had a long discussion with her about the progressive nature of diabetes and the pathology behind its complications. -her diabetes is complicated by obesity/sedentary life and she remains at a high risk for more acute and chronic complications which include CAD, CVA, CKD, retinopathy, and neuropathy. These are all discussed in detail with her.  - I have counseled her on diet management and weight loss, by adopting a carbohydrate restricted/protein rich diet.  -  Patient admits there is a room for improvement in her diet and drink choices. -  Suggestion is made for her to avoid simple carbohydrates  from her diet including Cakes, Sweet Desserts / Pastries, Ice Cream, Soda (diet and regular), Sweet Tea, Candies, Chips, Cookies, Store Bought Juices, Alcohol in Excess of  1-2 drinks a day, Artificial Sweeteners, and "Sugar-free" Products. This will help patient to have stable blood glucose profile and potentially avoid unintended weight gain.  - I encouraged her to switch to  unprocessed or minimally processed complex starch and increased protein intake (animal or plant source), fruits, and vegetables.  - she is advised to stick to a routine mealtimes to eat 3 meals  a day and avoid unnecessary snacks ( to snack only to correct hypoglycemia).   - she will be scheduled with Jearld Fenton, RDN, CDE for individualized diabetes education.  - I have approached her with the following individualized plan to manage diabetes and patient agrees:   -Given her current and prevailing glycemic burden, she will likely require insulin treatment in order for her to achieve control of diabetes to target.  -However, she does not display appropriate commitment and engagement for proper monitoring of blood glucose for safe use of insulin.  -She is approached again to start monitoring blood glucose strictly 4 times a day-before meals and at bedtime and return in 10 days with her meter and logs for reevaluation.  -In the meantime, she is advised to continue metformin 1000 mg p.o. twice daily, Tradjenta 5 mg p.o. daily at breakfast.   - she is encouraged to call clinic for blood glucose levels less than 70 or above 300 mg /dl.  - Patient specific target  A1c;  LDL, HDL, Triglycerides, and  Waist Circumference were discussed in detail.  2) Blood Pressure /Hypertension:  her blood pressure is controlled to target.    she is  advised to continue her current medications including  labetalol 200 mg p.o. daily 2 times daily.    3) Lipids/Hyperlipidemia: No recent lipid panel to review.  She is not on statins.  She will be considered for fasting lipid panel on subsequent visits.    4)  Weight/Diet:  Body mass index is 47.31 kg/m.  -  clearly complicating her diabetes care.  I discussed with her the fact that loss of 5 - 10% of her  current body weight will have the most impact on her diabetes management.  CDE Consult will be initiated . Exercise, and detailed carbohydrates information provided  -  detailed on discharge instructions.  5) Chronic Care/Health Maintenance:  -she  Is not on ACEI/ARB and Statin medications and  is encouraged to initiate and continue to follow up with Ophthalmology, Dentist,  Podiatrist at least yearly or according to recommendations, and advised to  stay away from smoking. I have recommended yearly flu vaccine and pneumonia vaccine at least every 5 years; moderate intensity exercise for up to 150 minutes weekly; and  sleep for at least 7 hours a day.  - she is  advised to maintain close follow up with [facility] [place] for primary care needs, as well as her other providers for optimal and coordinated care.  - Time spent with the patient: 25 min, of which >50% was spent in reviewing her blood glucose logs , discussing her hypoglycemia and hyperglycemia episodes, reviewing her current and  previous labs / studies and medications  doses and developing a plan to avoid hypoglycemia and hyperglycemia. Please refer to Patient Instructions for Blood Glucose Monitoring and Insulin/Medications Dosing Guide"  in media tab for additional information. Please  also refer to " Patient Self Inventory" in the Media  tab for reviewed elements of pertinent patient history.  Ruth Campbell participated in the discussions, expressed understanding, and voiced agreement with the above plans.  All questions were answered to her satisfaction. she is  encouraged to contact clinic should she have any questions or concerns prior to her return visit.   Follow up plan: - Return in about 10 days (around 11/14/2018) for Follow up with Pre-visit Labs, Meter, and Logs.  Glade Lloyd, MD [facility] [address] [place], [place] [address] Phone: [phone]  Fax: [phone]    11/04/2018, 3:09 PM  This note was partially dictated with voice recognition software. Similar sounding words can be transcribed inadequately or may not  be corrected upon review.

## 2018-11-05 ENCOUNTER — Telehealth: Payer: Self-pay

## 2018-11-05 ENCOUNTER — Other Ambulatory Visit: Payer: Self-pay

## 2018-11-05 DIAGNOSIS — E119 Type 2 diabetes mellitus without complications: Secondary | ICD-10-CM

## 2018-11-05 MED ORDER — ACCU-CHEK MULTICLIX LANCETS MISC: 12 refills | Status: DC

## 2018-11-05 MED ORDER — ACCU-CHEK MULTICLIX LANCETS MISC: 5 refills | Status: DC

## 2018-11-05 NOTE — Telephone Encounter (Signed)
Ruth Campbell, CMA  

## 2018-11-08 ENCOUNTER — Telehealth: Payer: Self-pay

## 2018-11-08 DIAGNOSIS — E119 Type 2 diabetes mellitus without complications: Secondary | ICD-10-CM

## 2018-11-08 MED ORDER — ACCU-CHEK MULTICLIX LANCETS MISC: 5 refills | Status: DC

## 2018-11-11 NOTE — Telephone Encounter (Signed)
Ruth Campbell, CMA  

## 2018-11-25 ENCOUNTER — Ambulatory Visit (INDEPENDENT_AMBULATORY_CARE_PROVIDER_SITE_OTHER): Payer: Medicaid Other | Admitting: "Endocrinology

## 2018-11-25 ENCOUNTER — Encounter: Payer: No Typology Code available for payment source | Admitting: Nutrition

## 2018-11-25 ENCOUNTER — Encounter: Payer: Self-pay | Admitting: "Endocrinology

## 2018-11-25 DIAGNOSIS — E559 Vitamin D deficiency, unspecified: Secondary | ICD-10-CM | POA: Diagnosis not present

## 2018-11-25 DIAGNOSIS — E119 Type 2 diabetes mellitus without complications: Secondary | ICD-10-CM | POA: Diagnosis not present

## 2018-11-25 MED ORDER — METFORMIN HCL 1000 MG PO TABS: 1000.0000 mg | ORAL_TABLET | Freq: Two times a day (BID) | ORAL | 3 refills | Status: AC

## 2018-11-25 MED ORDER — ACCU-CHEK MULTICLIX LANCETS MISC: 5 refills | Status: AC

## 2018-11-25 NOTE — Progress Notes (Signed)
11/25/2018, 10:20 AM                                                    Endocrinology Telephone Visit Follow up Note -During COVID -19 Pandemic   Subjective:    Patient ID: Ruth Campbell, female    DOB: 13-Jan-1986.  Ruth Campbell is engaged for telephone visit for follow-up of  management of currently uncontrolled symptomatic diabetes requested by  [facility] Personal.   Past Medical History:  Diagnosis Date  . Asthma   . Diabetes mellitus without complication ([place])   . Hypertension   . Obesity    History reviewed. No pertinent surgical history. Social History   Socioeconomic History  . Marital status: Single    Spouse name: Not on file  . Number of children: Not on file  . Years of education: Not on file  . Highest education level: Not on file  Occupational History  . Not on file  Social Needs  . Financial resource strain: Not on file  . Food insecurity:    Worry: Not on file    Inability: Not on file  . Transportation needs:    Medical: Not on file    Non-medical: Not on file  Tobacco Use  . Smoking status: Never Smoker  . Smokeless tobacco: Never Used  Substance and Sexual Activity  . Alcohol use: No    Frequency: Never  . Drug use: Not Currently    Types: Marijuana    Comment: last used 2014   . Sexual activity: Yes    Birth control/protection: None  Lifestyle  . Physical activity:    Days per week: Not on file    Minutes per session: Not on file  . Stress: Not on file  Relationships  . Social connections:    Talks on phone: Not on file    Gets together: Not on file    Attends religious service: Not on file    Active member of club or organization: Not on file    Attends meetings of clubs or organizations: Not on file    Relationship status: Not on file  Other Topics Concern  . Not on file  Social History Narrative  . Not on file   Outpatient  Encounter Medications as of 11/25/2018  Medication Sig  . Blood Glucose Monitoring Suppl (ACCU-CHEK GUIDE ME) w/Device KIT 1 Piece by Does not apply route 4 (four) times daily.  . Cholecalciferol (VITAMIN D3) 50000 units CAPS Take 1 capsule by mouth once a week.   Marland Kitchen glucose blood (ACCU-CHEK GUIDE) test strip Use as instructed 4 x daily. E11.65  . labetalol (NORMODYNE) 100 MG tablet Take 100 mg by mouth 2 (two) times daily.  . Lancets (ACCU-CHEK MULTICLIX) lancets Use as instructed to test BG 4 x daily. H41,74  . linagliptin (TRADJENTA) 5 MG TABS tablet Take 5 mg by mouth daily.  . metFORMIN (GLUCOPHAGE) 1000 MG tablet Take 1 tablet (1,000 mg total) by mouth 2 (two)  times daily with a meal.  . [DISCONTINUED] Lancets (ACCU-CHEK MULTICLIX) lancets Use as instructed to test BG 4 x daily. S56,81  . [DISCONTINUED] metFORMIN (GLUCOPHAGE) 1000 MG tablet Take 1,000 mg by mouth 2 (two) times daily with a meal.   No facility-administered encounter medications on file as of 11/25/2018.     ALLERGIES: No Known Allergies  VACCINATION STATUS: Immunization History  Administered Date(s) Administered  . Tdap 03/18/2014    Diabetes  She presents for her follow-up diabetic visit. She has type 2 diabetes mellitus. Onset time: She was diagnosed at approximate age of 89 years. Her disease course has been improving. There are no hypoglycemic associated symptoms. Pertinent negatives for hypoglycemia include no confusion, pallor or seizures. Associated symptoms include fatigue, polydipsia and polyuria. Pertinent negatives for diabetes include no polyphagia. There are no hypoglycemic complications. Symptoms are improving. There are no diabetic complications. Risk factors for coronary artery disease include diabetes mellitus, hypertension, family history, obesity and sedentary lifestyle. She is following a generally unhealthy diet. When asked about meal planning, she reported none. She has not had a previous visit with a  dietitian. She never participates in exercise. Her breakfast blood glucose range is generally 140-180 mg/dl. Her lunch blood glucose range is generally 130-140 mg/dl. Her dinner blood glucose range is generally 140-180 mg/dl. Her bedtime blood glucose range is generally 180-200 mg/dl. Her overall blood glucose range is 180-200 mg/dl. (Her previsit labs show improved A1c of 10.3%.) An ACE inhibitor/angiotensin II receptor blocker is not being taken. She does not see a podiatrist.Eye exam is not current.    Review of Systems  Constitutional: Positive for fatigue. Negative for chills, fever and unexpected weight change.  HENT: Negative for trouble swallowing and voice change.   Eyes: Negative for visual disturbance.  Respiratory: Negative for cough and wheezing.   Cardiovascular: Negative for leg swelling.  Gastrointestinal: Negative for diarrhea, nausea and vomiting.  Endocrine: Positive for polydipsia and polyuria. Negative for cold intolerance, heat intolerance and polyphagia.  Musculoskeletal: Negative for arthralgias and myalgias.  Skin: Negative for color change, pallor, rash and wound.  Neurological: Negative for seizures.  Psychiatric/Behavioral: Negative for confusion and suicidal ideas.    Objective:    There were no vitals taken for this visit.  Wt Readings from Last 3 Encounters:  11/04/18 (!) 325 lb (147.4 kg)  09/25/18 (!) 328 lb (148.8 kg)  07/20/18 289 lb (131.1 kg)     Physical Exam Constitutional:      Appearance: She is well-developed.  HENT:     Head: Normocephalic and atraumatic.  Neck:     Musculoskeletal: Normal range of motion and neck supple.     Thyroid: No thyromegaly.     Trachea: No tracheal deviation.  Cardiovascular:     Rate and Rhythm: Normal rate and regular rhythm.  Pulmonary:     Effort: Pulmonary effort is normal.     Breath sounds: Normal breath sounds.  Abdominal:     General: Bowel sounds are normal.     Palpations: Abdomen is soft.      Tenderness: There is no abdominal tenderness. There is no guarding.  Musculoskeletal: Normal range of motion.  Skin:    General: Skin is warm and dry.     Coloration: Skin is not pale.     Findings: No erythema or rash.  Neurological:     Mental Status: She is alert and oriented to person, place, and time.     Cranial Nerves: No cranial nerve  deficit.     Coordination: Coordination normal.     Deep Tendon Reflexes: Reflexes are normal and symmetric.  Psychiatric:        Judgment: Judgment normal.     CMP     Component Value Date/Time   NA 139 09/25/2018 1507   K 4.1 09/25/2018 1507   CL 103 09/25/2018 1507   CO2 23 09/25/2018 1507   GLUCOSE 307 (H) 09/25/2018 1507   BUN 12 09/25/2018 1507   BUN 12 01/23/2018   CREATININE 0.84 09/25/2018 1507   CALCIUM 10.0 09/25/2018 1507   PROT 7.3 09/25/2018 1507   ALBUMIN 3.8 04/18/2014 1808   AST 21 09/25/2018 1507   ALT 38 (H) 09/25/2018 1507   ALKPHOS 96 04/18/2014 1808   BILITOT 0.4 09/25/2018 1507   GFRNONAA 93 09/25/2018 1507   GFRAA 108 09/25/2018 1507    Recent Results (from the past 2160 hour(s))  Hemoglobin A1c     Status: Abnormal   Collection Time: 09/25/18  3:07 PM  Result Value Ref Range   Hgb A1c MFr Bld 10.3 (H) <5.7 % of total Hgb    Comment: For someone without known diabetes, a hemoglobin A1c value of 6.5% or greater indicates that they may have  diabetes and this should be confirmed with a follow-up  test. . For someone with known diabetes, a value <7% indicates  that their diabetes is well controlled and a value  greater than or equal to 7% indicates suboptimal  control. A1c targets should be individualized based on  duration of diabetes, age, comorbid conditions, and  other considerations. . Currently, no consensus exists regarding use of hemoglobin A1c for diagnosis of diabetes for children. .    Mean Plasma Glucose 249 (calc)   eAG (mmol/L) 13.8 (calc)  COMPLETE METABOLIC PANEL WITH GFR      Status: Abnormal   Collection Time: 09/25/18  3:07 PM  Result Value Ref Range   Glucose, Bld 307 (H) 65 - 99 mg/dL    Comment: .            Fasting reference interval . For someone without known diabetes, a glucose value >125 mg/dL indicates that they may have diabetes and this should be confirmed with a follow-up test. .    BUN 12 7 - 25 mg/dL   Creat 0.84 0.50 - 1.10 mg/dL   GFR, Est Non African American 93 > OR = 60 mL/min/1.38m   GFR, Est African American 108 > OR = 60 mL/min/1.727m  BUN/Creatinine Ratio NOT APPLICABLE 6 - 22 (calc)   Sodium 139 135 - 146 mmol/L   Potassium 4.1 3.5 - 5.3 mmol/L   Chloride 103 98 - 110 mmol/L   CO2 23 20 - 32 mmol/L   Calcium 10.0 8.6 - 10.2 mg/dL   Total Protein 7.3 6.1 - 8.1 g/dL   Albumin 4.5 3.6 - 5.1 g/dL   Globulin 2.8 1.9 - 3.7 g/dL (calc)   AG Ratio 1.6 1.0 - 2.5 (calc)   Total Bilirubin 0.4 0.2 - 1.2 mg/dL   Alkaline phosphatase (APISO) 131 (H) 33 - 115 U/L   AST 21 10 - 30 U/L   ALT 38 (H) 6 - 29 U/L  TSH     Status: None   Collection Time: 09/25/18  3:07 PM  Result Value Ref Range   TSH 3.22 mIU/L    Comment:           Reference Range .           >  or = 20 Years  0.40-4.50 .                Pregnancy Ranges           First trimester    0.26-2.66           Second trimester   0.55-2.73           Third trimester    0.43-2.91   T4, free     Status: None   Collection Time: 09/25/18  3:07 PM  Result Value Ref Range   Free T4 1.3 0.8 - 1.8 ng/dL  Microalbumin / creatinine urine ratio     Status: None   Collection Time: 09/25/18  3:07 PM  Result Value Ref Range   Creatinine, Urine 47 20 - 275 mg/dL   Microalb, Ur 1.3 mg/dL    Comment: Reference Range Not established    Microalb Creat Ratio 28 <30 mcg/mg creat    Comment: . The ADA defines abnormalities in albumin excretion as follows: Marland Kitchen Category         Result (mcg/mg creatinine) . Normal                    <30 Microalbuminuria         30-299  Clinical  albuminuria   > OR = 300 . The ADA recommends that at least two of three specimens collected within a 3-6 month period be abnormal before considering a patient to be within a diagnostic category.   VITAMIN D 25 Hydroxy (Vit-D Deficiency, Fractures)     Status: None   Collection Time: 09/25/18  3:07 PM  Result Value Ref Range   Vit D, 25-Hydroxy 35 30 - 100 ng/mL    Comment: Vitamin D Status         25-OH Vitamin D: . Deficiency:                    <20 ng/mL Insufficiency:             20 - 29 ng/mL Optimal:                 > or = 30 ng/mL . For 25-OH Vitamin D testing on patients on  D2-supplementation and patients for whom quantitation  of D2 and D3 fractions is required, the QuestAssureD(TM) 25-OH VIT D, (D2,D3), LC/MS/MS is recommended: order  code ([phone] (patients >51yr). . For more information on this test, go to: http://education.questdiagnostics.com/faq/FAQ163 (This link is being provided for  informational/educational purposes only.)      Assessment & Plan:   1. Type 2 diabetes mellitus without complication, without long-term current use of insulin (HCC)  - INigerDickerson has currently uncontrolled symptomatic type 2 DM since 33years of age. -Her most recent labs show A1c of 10.3% improving from 11.7%.    -He reports significantly improved glycemic profile over the phone.    -her diabetes is complicated by obesity/sedentary life and she remains at a high risk for more acute and chronic complications which include CAD, CVA, CKD, retinopathy, and neuropathy. These are all discussed in detail with her.  - I have counseled her on diet management and weight loss, by adopting a carbohydrate restricted/protein rich diet.  - Patient admits there is a room for improvement in her diet and drink choices. -  Suggestion is made for her to avoid simple carbohydrates  from her diet including Cakes, Sweet Desserts / Pastries, Ice Cream, Soda (diet and regular),  Sweet Tea,  Candies, Chips, Cookies, Store Bought Juices, Alcohol in Excess of  1-2 drinks a day, Artificial Sweeteners, and "Sugar-free" Products. This will help patient to have stable blood glucose profile and potentially avoid unintended weight gain.   -Given her report of near target glycemic profile, she will not be given insulin treatment for now.  Besides, she did not display appropriate commitment and engagement for proper monitoring of blood glucose for safe use of insulin.  -In the meantime, she is advised to continue metformin 1000 mg p.o. twice daily, Tradjenta 5 mg p.o. daily at breakfast.   - she is encouraged to call clinic for blood glucose levels less than 70 or above 300 mg /dl.  - Patient specific target  A1c;  LDL, HDL, Triglycerides, and  Waist Circumference were discussed in detail.  2) Lipids/Hyperlipidemia: No recent lipid panel to review.  She is not on statins.  She will be considered for fasting lipid panel on subsequent visits.     - she is  advised to maintain close follow up with [facility] for primary care needs, as well as her other providers for optimal and coordinated care.  - Patient Care Time Today:  25 min, of which >50% was spent in reviewing her  current and  previous labs/studies, previous treatments, and medications doses and developing a plan for long-term care based on the latest recommendations for standards of care.  Ruth Campbell participated in the discussions, expressed understanding, and voiced agreement with the above plans.  All questions were answered to her satisfaction. she is encouraged to contact clinic should she have any questions or concerns prior to her return visit.  Follow up plan: - Return in about 3 months (around 02/25/2019) for Follow up with Pre-visit Labs, Meter, and Logs.  Glade Lloyd, MD [facility] [address] [place], [place] [address] Phone:  [phone]  Fax: [phone]    11/25/2018, 10:20 AM  This note was partially dictated with voice recognition software. Similar sounding words can be transcribed inadequately or may not  be corrected upon review.

## 2018-12-12 ENCOUNTER — Ambulatory Visit: Payer: Medicaid Other | Admitting: Nutrition

## 2018-12-12 ENCOUNTER — Telehealth: Payer: Self-pay | Admitting: Nutrition

## 2018-12-12 NOTE — Telephone Encounter (Signed)
TC to pt. She requested to reschedule telephone visit til 4 pm. Called at 4 pm and left VM to return call.

## 2019-02-26 ENCOUNTER — Ambulatory Visit: Payer: Medicaid Other | Admitting: "Endocrinology

## 2019-02-26 ENCOUNTER — Other Ambulatory Visit: Payer: Self-pay

## 2019-03-31 ENCOUNTER — Other Ambulatory Visit: Payer: Self-pay

## 2019-03-31 ENCOUNTER — Other Ambulatory Visit: Payer: Medicaid Other

## 2019-03-31 DIAGNOSIS — Z20822 Contact with and (suspected) exposure to covid-19: Secondary | ICD-10-CM

## 2019-04-02 LAB — NOVEL CORONAVIRUS, NAA: SARS-CoV-2, NAA: NOT DETECTED

## 2019-06-23 ENCOUNTER — Other Ambulatory Visit: Payer: Self-pay | Admitting: "Endocrinology

## 2019-08-29 ENCOUNTER — Other Ambulatory Visit: Payer: Self-pay | Admitting: "Endocrinology

## 2019-11-07 ENCOUNTER — Other Ambulatory Visit: Payer: Self-pay | Admitting: "Endocrinology

## 2020-02-23 ENCOUNTER — Other Ambulatory Visit: Payer: Self-pay

## 2020-02-23 ENCOUNTER — Telehealth: Payer: Self-pay | Admitting: "Endocrinology

## 2020-02-23 DIAGNOSIS — I10 Essential (primary) hypertension: Secondary | ICD-10-CM

## 2020-02-23 DIAGNOSIS — E119 Type 2 diabetes mellitus without complications: Secondary | ICD-10-CM

## 2020-02-23 NOTE — Telephone Encounter (Signed)
Pt has not been here since last year , can you update her labs.

## 2020-02-23 NOTE — Telephone Encounter (Signed)
Updated orders.

## 2020-03-26 DIAGNOSIS — K610 Anal abscess: Secondary | ICD-10-CM | POA: Insufficient documentation

## 2020-04-07 LAB — MICROALBUMIN, URINE: Microalb, Ur: 5.4

## 2020-04-07 LAB — VITAMIN D 25 HYDROXY (VIT D DEFICIENCY, FRACTURES): Vit D, 25-Hydroxy: 16

## 2020-05-04 LAB — HEMOGLOBIN A1C: Hemoglobin A1C: 11

## 2020-05-06 ENCOUNTER — Ambulatory Visit (INDEPENDENT_AMBULATORY_CARE_PROVIDER_SITE_OTHER): Payer: Medicaid Other | Admitting: "Endocrinology

## 2020-05-06 ENCOUNTER — Encounter: Payer: Self-pay | Admitting: "Endocrinology

## 2020-05-06 ENCOUNTER — Other Ambulatory Visit: Payer: Self-pay

## 2020-05-06 VITALS — BP 134/86 | HR 85 | Ht 67.0 in | Wt 318.6 lb

## 2020-05-06 DIAGNOSIS — I10 Essential (primary) hypertension: Secondary | ICD-10-CM | POA: Diagnosis not present

## 2020-05-06 DIAGNOSIS — E119 Type 2 diabetes mellitus without complications: Secondary | ICD-10-CM

## 2020-05-06 NOTE — Patient Instructions (Signed)

## 2020-05-06 NOTE — Progress Notes (Signed)
05/06/2020, 1:42 PM   Endocrinology follow-up note    Subjective:    Patient ID: Ruth Campbell, female    DOB: 1985/11/12.  Ruth Hirata is returning as a new patient for management of uncontrolled type 2 diabetes.  However this patient was previously seen for the same diagnosis in January-March 2020.  She did not return for follow-up.  Her original consult came from [place].    She still goes there for primary care.Health, [facility] Dept Personal.   Past Medical History:  Diagnosis Date  . Asthma   . Diabetes mellitus without complication ([place])   . Hypertension   . Obesity    History reviewed. No pertinent surgical history. Social History   Socioeconomic History  . Marital status: Single    Spouse name: Not on file  . Number of children: Not on file  . Years of education: Not on file  . Highest education level: Not on file  Occupational History  . Not on file  Tobacco Use  . Smoking status: Never Smoker  . Smokeless tobacco: Never Used  Vaping Use  . Vaping Use: Never used  Substance and Sexual Activity  . Alcohol use: No  . Drug use: Not Currently    Types: Marijuana    Comment: last used 2014   . Sexual activity: Yes    Birth control/protection: None  Other Topics Concern  . Not on file  Social History Narrative  . Not on file   Social Determinants of Health   Financial Resource Strain:   . Difficulty of Paying Living Expenses: Not on file  Food Insecurity:   . Worried About Charity fundraiser in the Last Year: Not on file  . Ran Out of Food in the Last Year: Not on file  Transportation Needs:   . Lack of Transportation (Medical): Not on file  . Lack of Transportation (Non-Medical): Not on file  Physical Activity:   . Days of Exercise per Week: Not on file  . Minutes of Exercise per Session: Not on file  Stress:   . Feeling of  Stress : Not on file  Social Connections:   . Frequency of Communication with Friends and Family: Not on file  . Frequency of Social Gatherings with Friends and Family: Not on file  . Attends Religious Services: Not on file  . Active Member of Clubs or Organizations: Not on file  . Attends Archivist Meetings: Not on file  . Marital Status: Not on file   Outpatient Encounter Medications as of 05/06/2020  Medication Sig  . ACCU-CHEK GUIDE test strip USE TO TEST BLOOD SUGAR FOUR TIMES DAILY  . Blood Glucose Monitoring Suppl (ACCU-CHEK GUIDE ME) w/Device KIT 1 Piece by Does not apply route 4 (four) times daily.  Marland Kitchen GLOBAL INJECT EASE INSULIN SYR 31G X 5/16" 0.5 ML MISC Inject 1 Syringe into the skin 2 (two) times daily.  Marland Kitchen ibuprofen (ADVIL) 400 MG tablet   . labetalol (NORMODYNE) 100 MG tablet Take 100 mg by mouth 2 (two) times daily.  . Lancets (ACCU-CHEK MULTICLIX) lancets Use as instructed to test BG 4 x  daily. [id number]  . LANTUS 100 UNIT/ML injection Inject 40 Units into the skin at bedtime.  . metFORMIN (GLUCOPHAGE) 1000 MG tablet Take 1 tablet (1,000 mg total) by mouth 2 (two) times daily with a meal.  . [DISCONTINUED] Cholecalciferol (VITAMIN D3) 50000 units CAPS Take 1 capsule by mouth once a week.   . [DISCONTINUED] linagliptin (TRADJENTA) 5 MG TABS tablet Take 5 mg by mouth daily.   No facility-administered encounter medications on file as of 05/06/2020.    ALLERGIES: No Known Allergies  VACCINATION STATUS: Immunization History  Administered Date(s) Administered  . Tdap 03/18/2014    Diabetes She presents for her follow-up diabetic visit. She has type 2 diabetes mellitus. Onset time: She was diagnosed at approximate age of 34 years. Her disease course has been worsening (This patient was previously seen in this clinic for management of uncontrolled type 2 diabetes, unfortunately she did not return for follow-up.). There are no hypoglycemic associated symptoms. Pertinent  negatives for hypoglycemia include no confusion, pallor or seizures. Associated symptoms include fatigue, polydipsia and polyuria. Pertinent negatives for diabetes include no polyphagia. There are no hypoglycemic complications. Symptoms are worsening. There are no diabetic complications. Risk factors for coronary artery disease include diabetes mellitus, hypertension, family history, obesity and sedentary lifestyle. Current diabetic treatments: She is currently on Lantus 20 units nightly, and Metformin 1000 mg p.o. twice daily. She is following a generally unhealthy diet. When asked about meal planning, she reported none. She has not had a previous visit with a dietitian. She never participates in exercise. Her home blood glucose trend is fluctuating minimally. (She did not bring any logs nor meter with her.  Office notes from her PCP show that her A1c is 11% at this time increasing from 10.3% when she was last seen here.  She is also reportedly trying to achieve pregnancy.   ) An ACE inhibitor/angiotensin II receptor blocker is not being taken. She does not see a podiatrist.Eye exam is not current.    Review of Systems  Constitutional: Positive for fatigue. Negative for chills, fever and unexpected weight change.  HENT: Negative for trouble swallowing and voice change.   Eyes: Negative for visual disturbance.  Respiratory: Negative for cough and wheezing.   Cardiovascular: Negative for leg swelling.  Gastrointestinal: Negative for diarrhea, nausea and vomiting.  Endocrine: Positive for polydipsia and polyuria. Negative for cold intolerance, heat intolerance and polyphagia.  Musculoskeletal: Negative for arthralgias and myalgias.  Skin: Negative for color change, pallor, rash and wound.  Neurological: Negative for seizures.  Psychiatric/Behavioral: Negative for confusion and suicidal ideas.    Objective:    BP 134/86 (BP Location: Right Arm, Patient Position: Sitting)   Pulse 85   Ht $R'5\' 7"'dP$   (1.702 m)   Wt (!) 318 lb 9.6 oz (144.5 kg)   BMI 49.90 kg/m   Wt Readings from Last 3 Encounters:  05/06/20 (!) 318 lb 9.6 oz (144.5 kg)  11/04/18 (!) 325 lb (147.4 kg)  09/25/18 (!) 328 lb (148.8 kg)     Physical Exam Constitutional:      Appearance: She is well-developed.  HENT:     Head: Normocephalic and atraumatic.  Neck:     Thyroid: No thyromegaly.     Trachea: No tracheal deviation.  Cardiovascular:     Rate and Rhythm: Normal rate and regular rhythm.  Pulmonary:     Effort: Pulmonary effort is normal.     Breath sounds: Normal breath sounds.  Abdominal:     General:  Bowel sounds are normal.     Palpations: Abdomen is soft.     Tenderness: There is no abdominal tenderness. There is no guarding.  Musculoskeletal:        General: Normal range of motion.     Cervical back: Normal range of motion and neck supple.  Skin:    General: Skin is warm and dry.     Coloration: Skin is not pale.     Findings: No erythema or rash.  Neurological:     Mental Status: She is alert and oriented to person, place, and time.     Cranial Nerves: No cranial nerve deficit.     Coordination: Coordination normal.     Deep Tendon Reflexes: Reflexes are normal and symmetric.  Psychiatric:        Judgment: Judgment normal.     CMP     Component Value Date/Time   NA 139 09/25/2018 1507   K 4.1 09/25/2018 1507   CL 103 09/25/2018 1507   CO2 23 09/25/2018 1507   GLUCOSE 307 (H) 09/25/2018 1507   BUN 12 09/25/2018 1507   BUN 12 01/23/2018 0000   CREATININE 0.84 09/25/2018 1507   CALCIUM 10.0 09/25/2018 1507   PROT 7.3 09/25/2018 1507   ALBUMIN 3.8 04/18/2014 1808   AST 21 09/25/2018 1507   ALT 38 (H) 09/25/2018 1507   ALKPHOS 96 04/18/2014 1808   BILITOT 0.4 09/25/2018 1507   GFRNONAA 93 09/25/2018 1507   GFRAA 108 09/25/2018 1507   Recent Results (from the past 2160 hour(s))  Hemoglobin A1c     Status: None   Collection Time: 05/04/20 12:00 AM  Result Value Ref Range    Hemoglobin A1C 11       Assessment & Plan:   1. Type 2 diabetes mellitus without complication, without long-term current use of insulin (HCC)  - [place] Degante has currently uncontrolled symptomatic type 2 DM since 34 years of age. She missed her appointment since March 2020.  She did not bring any logs nor meter with her.  Office notes from her PCP show that her A1c is 11% at this time increasing from 10.3% when she was last seen here.  She is also reportedly trying to achieve pregnancy.  She reports blood glucose readings between 200-300 mg per DL on average.  -He reports significantly improved glycemic profile over the phone.    -her diabetes is complicated by obesity/sedentary life and she remains at a high risk for more acute and chronic complications which include CAD, CVA, CKD, retinopathy, and neuropathy. These are all discussed in detail with her.  - I have counseled her on diet management and weight loss, by adopting a carbohydrate restricted/protein rich diet. - she  admits there is a room for improvement in her diet and drink choices. -  Suggestion is made for her to avoid simple carbohydrates  from her diet including Cakes, Sweet Desserts / Pastries, Ice Cream, Soda (diet and regular), Sweet Tea, Candies, Chips, Cookies, Sweet Pastries,  Store Bought Juices, Alcohol in Excess of  1-2 drinks a day, Artificial Sweeteners, Coffee Creamer, and "Sugar-free" Products. This will help patient to have stable blood glucose profile and potentially avoid unintended weight gain.   -In light of her prevailing and current glycemic burden with A1c of 11% she is advised to delay pregnancy until she achieves A1c under 7%.   -In preparation, she is advised to increase her Lantus to 40 units nightly, start strict monitoring of blood glucose  4 times a day-daily before meals and at bedtime and return in a week with her meter and logs for evaluation.   -If she engages properly for safe use of insulin,  she will be considered for prandial insulin in order for her to achieve glycemic control soon to clear way for her pregnancy.  -In the meantime, she is advised to continue metformin 1000 mg p.o. twice daily   - she is encouraged to call clinic for blood glucose levels less than 70 or above 200 mg /dl.  - Patient specific target  A1c;  LDL, HDL, Triglycerides, and  Waist Circumference were discussed in detail.  2) Lipids/Hyperlipidemia: No recent lipid panel to review.  She is not on statins.  She will be considered for fasting lipid panel on subsequent visits.    3) morbid obesity: BMI of 49.9.  The single most important intervention for her would be weight loss.  Weight loss by caloric restrictions discussed in detail with her, see above.  - she is  advised to maintain close follow up with Health, [facility] for primary care needs, as well as her other providers for optimal and coordinated care.  -- Time spent on this patient care encounter:  40 min, of which > 50% was spent in  counseling and the rest reviewing her blood glucose logs , discussing her hypoglycemia and hyperglycemia episodes, reviewing her current and  previous labs / studies  ( including abstraction from other facilities) and medications  doses and developing a  long term treatment plan and documenting her care.   Please refer to Patient Instructions for Blood Glucose Monitoring and Insulin/Medications Dosing Guide"  in media tab for additional information. Please  also refer to " Patient Self Inventory" in the Media  tab for reviewed elements of pertinent patient history.  Ruth Gladd participated in the discussions, expressed understanding, and voiced agreement with the above plans.  All questions were answered to her satisfaction. she is encouraged to contact clinic should she have any questions or concerns prior to her return visit.   Follow up plan: - Return in about 1 week (around 05/13/2020)  for F/U with Meter and Logs Only - no Labs.  Glade Lloyd, MD [facility] [address] [place],  [address] Phone: [phone]  Fax: [phone]    05/06/2020, 1:42 PM  This note was partially dictated with voice recognition software. Similar sounding words can be transcribed inadequately or may not  be corrected upon review.

## 2020-05-13 ENCOUNTER — Other Ambulatory Visit: Payer: Self-pay

## 2020-05-13 ENCOUNTER — Ambulatory Visit (INDEPENDENT_AMBULATORY_CARE_PROVIDER_SITE_OTHER): Payer: Medicaid Other | Admitting: "Endocrinology

## 2020-05-13 ENCOUNTER — Encounter: Payer: Self-pay | Admitting: "Endocrinology

## 2020-05-13 VITALS — BP 128/84 | HR 76 | Ht 67.0 in | Wt 319.0 lb

## 2020-05-13 DIAGNOSIS — I10 Essential (primary) hypertension: Secondary | ICD-10-CM | POA: Diagnosis not present

## 2020-05-13 DIAGNOSIS — E119 Type 2 diabetes mellitus without complications: Secondary | ICD-10-CM | POA: Diagnosis not present

## 2020-05-13 NOTE — Patient Instructions (Signed)

## 2020-05-13 NOTE — Progress Notes (Signed)
05/13/2020, 1:56 PM   Endocrinology follow-up note    Subjective:    Patient ID: Ruth Campbell, female    DOB: September 20, 1985.  Ruth Campbell is returning for follow-up for the management of her currently uncontrolled type 2 diabetes.  She was recently initiated on basal insulin along with her Metformin.   [facility].   Past Medical History:  Diagnosis Date  . Asthma   . Diabetes mellitus without complication ([place])   . Hypertension   . Obesity    History reviewed. No pertinent surgical history. Social History   Socioeconomic History  . Marital status: Single    Spouse name: Not on file  . Number of children: Not on file  . Years of education: Not on file  . Highest education level: Not on file  Occupational History  . Not on file  Tobacco Use  . Smoking status: Never Smoker  . Smokeless tobacco: Never Used  Vaping Use  . Vaping Use: Never used  Substance and Sexual Activity  . Alcohol use: No  . Drug use: Not Currently    Types: Marijuana    Comment: last used 2014   . Sexual activity: Yes    Birth control/protection: None  Other Topics Concern  . Not on file  Social History Narrative  . Not on file   Social Determinants of Health   Financial Resource Strain:   . Difficulty of Paying Living Expenses: Not on file  Food Insecurity:   . Worried About Charity fundraiser in the Last Year: Not on file  . Ran Out of Food in the Last Year: Not on file  Transportation Needs:   . Lack of Transportation (Medical): Not on file  . Lack of Transportation (Non-Medical): Not on file  Physical Activity:   . Days of Exercise per Week: Not on file  . Minutes of Exercise per Session: Not on file  Stress:   . Feeling of Stress : Not on file  Social Connections:   . Frequency of Communication with Friends and Family: Not on file  . Frequency of Social Gatherings  with Friends and Family: Not on file  . Attends Religious Services: Not on file  . Active Member of Clubs or Organizations: Not on file  . Attends Archivist Meetings: Not on file  . Marital Status: Not on file   Outpatient Encounter Medications as of 05/13/2020  Medication Sig  . ACCU-CHEK GUIDE test strip USE TO TEST BLOOD SUGAR FOUR TIMES DAILY  . Blood Glucose Monitoring Suppl (ACCU-CHEK GUIDE ME) w/Device KIT 1 Piece by Does not apply route 4 (four) times daily.  Marland Kitchen GLOBAL INJECT EASE INSULIN SYR 31G X 5/16" 0.5 ML MISC Inject 1 Syringe into the skin 2 (two) times daily.  Marland Kitchen ibuprofen (ADVIL) 400 MG tablet   . labetalol (NORMODYNE) 100 MG tablet Take 100 mg by mouth 2 (two) times daily.  . Lancets (ACCU-CHEK MULTICLIX) lancets Use as instructed to test BG 4 x daily. P38,25  . LANTUS 100 UNIT/ML injection Inject 60 Units into the skin at bedtime.  . metFORMIN (GLUCOPHAGE) 1000 MG tablet Take 1  tablet (1,000 mg total) by mouth 2 (two) times daily with a meal.   No facility-administered encounter medications on file as of 05/13/2020.    ALLERGIES: No Known Allergies  VACCINATION STATUS: Immunization History  Administered Date(s) Administered  . Tdap 03/18/2014    Diabetes She presents for her follow-up diabetic visit. She has type 2 diabetes mellitus. Onset time: She was diagnosed at approximate age of 34 years. Her disease course has been improving (This patient was previously seen in this clinic for management of uncontrolled type 2 diabetes, unfortunately she did not return for follow-up.). There are no hypoglycemic associated symptoms. Pertinent negatives for hypoglycemia include no confusion, pallor or seizures. Associated symptoms include fatigue. Pertinent negatives for diabetes include no polydipsia, no polyphagia and no polyuria. There are no hypoglycemic complications. Symptoms are improving. There are no diabetic complications. Risk factors for coronary artery disease  include diabetes mellitus, hypertension, family history, obesity and sedentary lifestyle. Her weight is fluctuating minimally. She is following a generally unhealthy diet. When asked about meal planning, she reported none. She has not had a previous visit with a dietitian. She never participates in exercise. Her home blood glucose trend is decreasing steadily. Her breakfast blood glucose range is generally 140-180 mg/dl. Her lunch blood glucose range is generally 140-180 mg/dl. Her dinner blood glucose range is generally 140-180 mg/dl. Her bedtime blood glucose range is generally 140-180 mg/dl. Her overall blood glucose range is 140-180 mg/dl. (She presents with her meter and logs showing significantly improved glycemic profile, averaging 177 for the last 7 days, 205 for the last 14 days, 235 for the last 30 days.  Her recent A1c was 11%.  She is responding to her recent addition of basal insulin.   ) An ACE inhibitor/angiotensin II receptor blocker is not being taken. She does not see a podiatrist.Eye exam is not current.    Review of Systems  Constitutional: Positive for fatigue. Negative for chills, fever and unexpected weight change.  HENT: Negative for trouble swallowing and voice change.   Eyes: Negative for visual disturbance.  Respiratory: Negative for cough and wheezing.   Cardiovascular: Negative for leg swelling.  Gastrointestinal: Negative for diarrhea, nausea and vomiting.  Endocrine: Negative for cold intolerance, heat intolerance, polydipsia, polyphagia and polyuria.  Musculoskeletal: Negative for arthralgias and myalgias.  Skin: Negative for color change, pallor, rash and wound.  Neurological: Negative for seizures.  Psychiatric/Behavioral: Negative for confusion and suicidal ideas.    Objective:    BP 128/84   Pulse 76   Ht '5\' 7"'  (1.702 m)   Wt (!) 319 lb (144.7 kg)   BMI 49.96 kg/m   Wt Readings from Last 3 Encounters:  05/13/20 (!) 319 lb (144.7 kg)  05/06/20 (!) 318 lb  9.6 oz (144.5 kg)  11/04/18 (!) 325 lb (147.4 kg)     Physical Exam Constitutional:      Appearance: She is well-developed.  HENT:     Head: Normocephalic and atraumatic.  Neck:     Thyroid: No thyromegaly.     Trachea: No tracheal deviation.  Cardiovascular:     Rate and Rhythm: Normal rate and regular rhythm.  Pulmonary:     Effort: Pulmonary effort is normal.     Breath sounds: Normal breath sounds.  Abdominal:     General: Bowel sounds are normal.     Palpations: Abdomen is soft.     Tenderness: There is no abdominal tenderness. There is no guarding.  Musculoskeletal:  General: Normal range of motion.     Cervical back: Normal range of motion and neck supple.  Skin:    General: Skin is warm and dry.     Coloration: Skin is not pale.     Findings: No erythema or rash.  Neurological:     Mental Status: She is alert and oriented to person, place, and time.     Cranial Nerves: No cranial nerve deficit.     Coordination: Coordination normal.     Deep Tendon Reflexes: Reflexes are normal and symmetric.  Psychiatric:        Judgment: Judgment normal.     CMP     Component Value Date/Time   NA 139 09/25/2018 1507   K 4.1 09/25/2018 1507   CL 103 09/25/2018 1507   CO2 23 09/25/2018 1507   GLUCOSE 307 (H) 09/25/2018 1507   BUN 12 09/25/2018 1507   BUN 12 01/23/2018 0000   CREATININE 0.84 09/25/2018 1507   CALCIUM 10.0 09/25/2018 1507   PROT 7.3 09/25/2018 1507   ALBUMIN 3.8 04/18/2014 1808   AST 21 09/25/2018 1507   ALT 38 (H) 09/25/2018 1507   ALKPHOS 96 04/18/2014 1808   BILITOT 0.4 09/25/2018 1507   GFRNONAA 93 09/25/2018 1507   GFRAA 108 09/25/2018 1507   Recent Results (from the past 2160 hour(s))  Hemoglobin A1c     Status: None   Collection Time: 05/04/20 12:00 AM  Result Value Ref Range   Hemoglobin A1C 11       Assessment & Plan:   1. Type 2 diabetes mellitus without complication, without long-term current use of insulin (HCC)  - Ruth  Campbell has currently uncontrolled symptomatic type 2 DM since 34 years of age. She presents with her meter and logs showing significantly improved glycemic profile, averaging 177 for the last 7 days, 205 for the last 14 days, 235 for the last 30 days.  Her recent A1c was 11%.  She is responding to her recent addition of basal insulin.    She is also reportedly trying to achieve pregnancy.    -her diabetes is complicated by obesity/sedentary life and she remains at a high risk for more acute and chronic complications which include CAD, CVA, CKD, retinopathy, and neuropathy. These are all discussed in detail with her.  - I have counseled her on diet management and weight loss, by adopting a carbohydrate restricted/protein rich diet.  - she  admits there is a room for improvement in her diet and drink choices. -  Suggestion is made for her to avoid simple carbohydrates  from her diet including Cakes, Sweet Desserts / Pastries, Ice Cream, Soda (diet and regular), Sweet Tea, Candies, Chips, Cookies, Sweet Pastries,  Store Bought Juices, Alcohol in Excess of  1-2 drinks a day, Artificial Sweeteners, Coffee Creamer, and "Sugar-free" Products. This will help patient to have stable blood glucose profile and potentially avoid unintended weight gain.   -In light of her prevailing and current glycemic burden with A1c of 11% she is advised to delay pregnancy until she achieves A1c under 7%.   -In preparation, she is advised to increase her Lantus to 60 units nightly, continue monitoring blood glucose at least twice a day-daily before breakfast and at bedtime. - she is encouraged to call clinic for blood glucose levels less than 70 or above 200 mg /dl. -Based on her presentation with near target glycemic profile, she will not need prandial insulin for now.   -If she returns  with significant postprandial hyperglycemia during her next visit, she will be considered for prandial insulin in order for her to achieve  glycemic control soon to clear way for her pregnancy.  -In the meantime, she is advised to continue metformin 1000 mg p.o. twice daily  - Patient specific target  A1c;  LDL, HDL, Triglycerides  were discussed in detail.  2) Lipids/Hyperlipidemia: No recent lipid panel to review.  She is not on statins.  She will be considered for fasting lipid panel on subsequent visits.    3) morbid obesity: BMI of 49.96 .  In order for her to achieve control of diabetes and achieve pregnancy successfully, the single most important intervention for her would be weight loss.  Weight loss by caloric restrictions discussed in detail with her, see above.  - she is  advised to maintain close follow up with Health, [place] for primary care needs, as well as her other providers for optimal and coordinated care.  - Time spent on this patient care encounter:  35 min, of which > 50% was spent in  counseling and the rest reviewing her blood glucose logs , discussing her hypoglycemia and hyperglycemia episodes, reviewing her current and  previous labs / studies  ( including abstraction from other facilities) and medications  doses and developing a  long term treatment plan and documenting her care.   Please refer to Patient Instructions for Blood Glucose Monitoring and Insulin/Medications Dosing Guide"  in media tab for additional information. Please  also refer to " Patient Self Inventory" in the Media  tab for reviewed elements of pertinent patient history.  Ruth Campbell participated in the discussions, expressed understanding, and voiced agreement with the above plans.  All questions were answered to her satisfaction. she is encouraged to contact clinic should she have any questions or concerns prior to her return visit.   Follow up plan: - Return in about 3 months (around 08/12/2020) for F/U with Pre-visit Labs, Meter, Logs, A1c here.Glade Lloyd, MD [facility]  [facility] [address] [place], [place] [address] Phone: [phone]  Fax: [phone]    05/13/2020, 1:56 PM  This note was partially dictated with voice recognition software. Similar sounding words can be transcribed inadequately or may not  be corrected upon review.

## 2020-07-28 LAB — HEMOGLOBIN A1C: Hemoglobin A1C: 7.9

## 2020-07-28 LAB — LIPID PANEL
Cholesterol: 193 (ref 0–200)
HDL: 59 (ref 35–70)
LDL Cholesterol: 117
Triglycerides: 78 (ref 40–160)

## 2020-07-28 LAB — BASIC METABOLIC PANEL
BUN: 15 (ref 4–21)
CO2: 31 — AB (ref 13–22)
Chloride: 103 (ref 99–108)
Creatinine: 0.8 (ref 0.5–1.1)
Glucose: 238
Potassium: 4.9 (ref 3.4–5.3)
Sodium: 139 (ref 137–147)

## 2020-07-28 LAB — HEPATIC FUNCTION PANEL
ALT: 12 (ref 7–35)
AST: 11 — AB (ref 13–35)
Alkaline Phosphatase: 107 (ref 25–125)
Bilirubin, Total: 0.5

## 2020-07-28 LAB — TSH: TSH: 2 (ref 0.41–5.90)

## 2020-07-28 LAB — COMPREHENSIVE METABOLIC PANEL
Albumin: 4.5 (ref 3.5–5.0)
Calcium: 9.6 (ref 8.7–10.7)
GFR calc Af Amer: 113
GFR calc non Af Amer: 98
Globulin: 2.7

## 2020-08-13 ENCOUNTER — Ambulatory Visit: Payer: Medicaid Other | Admitting: "Endocrinology

## 2020-09-13 ENCOUNTER — Telehealth: Payer: Medicaid Other | Admitting: "Endocrinology

## 2020-10-11 ENCOUNTER — Ambulatory Visit: Payer: Medicaid Other | Admitting: "Endocrinology

## 2020-10-15 ENCOUNTER — Ambulatory Visit (INDEPENDENT_AMBULATORY_CARE_PROVIDER_SITE_OTHER): Payer: Medicaid Other | Admitting: "Endocrinology

## 2020-10-15 ENCOUNTER — Other Ambulatory Visit: Payer: Self-pay

## 2020-10-15 ENCOUNTER — Encounter: Payer: Self-pay | Admitting: "Endocrinology

## 2020-10-15 VITALS — BP 128/90 | HR 80 | Ht 67.0 in | Wt 326.2 lb

## 2020-10-15 DIAGNOSIS — I10 Essential (primary) hypertension: Secondary | ICD-10-CM

## 2020-10-15 DIAGNOSIS — E119 Type 2 diabetes mellitus without complications: Secondary | ICD-10-CM

## 2020-10-15 DIAGNOSIS — E782 Mixed hyperlipidemia: Secondary | ICD-10-CM

## 2020-10-15 MED ORDER — BD PEN NEEDLE SHORT U/F 31G X 8 MM MISC: 1.0000 | 3 refills | Status: AC

## 2020-10-15 MED ORDER — LANTUS SOLOSTAR 100 UNIT/ML ~~LOC~~ SOPN: 60.0000 [IU] | PEN_INJECTOR | Freq: Every day | SUBCUTANEOUS | 2 refills | Status: DC

## 2020-10-15 NOTE — Progress Notes (Signed)
10/15/2020, 8:57 AM   Endocrinology follow-up note   Subjective:    Patient ID: Ruth Campbell, female    DOB: Jul 05, 1986.  Ruth Campbell is returning for follow-up in the management of currently uncontrolled type 2 diabetes, hypertension, weight management.    JYN:WGNFA, [place] Dragon, FNP.   Past Medical History:  Diagnosis Date  . Asthma   . Diabetes mellitus without complication ([place])   . Hypertension   . Obesity    History reviewed. No pertinent surgical history. Social History   Socioeconomic History  . Marital status: Single    Spouse name: Not on file  . Number of children: Not on file  . Years of education: Not on file  . Highest education level: Not on file  Occupational History  . Not on file  Tobacco Use  . Smoking status: Never Smoker  . Smokeless tobacco: Never Used  Vaping Use  . Vaping Use: Never used  Substance and Sexual Activity  . Alcohol use: No  . Drug use: Not Currently    Types: Marijuana    Comment: last used 2014   . Sexual activity: Yes    Birth control/protection: None  Other Topics Concern  . Not on file  Social History Narrative  . Not on file   Social Determinants of Health   Financial Resource Strain: Not on file  Food Insecurity: Not on file  Transportation Needs: Not on file  Physical Activity: Not on file  Stress: Not on file  Social Connections: Not on file   Outpatient Encounter Medications as of 10/15/2020  Medication Sig  . insulin glargine (LANTUS SOLOSTAR) 100 UNIT/ML Solostar Pen Inject 60 Units into the skin daily.  . Insulin Pen Needle (B-D ULTRAFINE III SHORT PEN) 31G X 8 MM MISC 1 each by Does not apply route as directed.  Marland Kitchen ACCU-CHEK GUIDE test strip USE TO TEST BLOOD SUGAR FOUR TIMES DAILY  . Blood Glucose Monitoring Suppl (ACCU-CHEK GUIDE ME) w/Device KIT 1 Piece by Does not apply route 4 (four) times daily.  Marland Kitchen GLOBAL INJECT EASE  INSULIN SYR 31G X 5/16" 0.5 ML MISC Inject 1 Syringe into the skin 2 (two) times daily.  Marland Kitchen ibuprofen (ADVIL) 400 MG tablet   . labetalol (NORMODYNE) 100 MG tablet Take 100 mg by mouth 2 (two) times daily.  . Lancets (ACCU-CHEK MULTICLIX) lancets Use as instructed to test BG 4 x daily. O13,08  . metFORMIN (GLUCOPHAGE) 1000 MG tablet Take 1 tablet (1,000 mg total) by mouth 2 (two) times daily with a meal.  . [DISCONTINUED] LANTUS 100 UNIT/ML injection Inject 60 Units into the skin at bedtime.   No facility-administered encounter medications on file as of 10/15/2020.    ALLERGIES: No Known Allergies  VACCINATION STATUS: Immunization History  Administered Date(s) Administered  . Tdap 03/18/2014    Diabetes She presents for her follow-up diabetic visit. She has type 2 diabetes mellitus. Onset time: She was diagnosed at approximate age of 63 years. Her disease course has been improving (This patient was previously seen in this clinic for management of uncontrolled type 2 diabetes, unfortunately she did not return for follow-up.). There are no hypoglycemic  associated symptoms. Pertinent negatives for hypoglycemia include no confusion, pallor or seizures. Associated symptoms include fatigue. Pertinent negatives for diabetes include no polydipsia, no polyphagia and no polyuria. There are no hypoglycemic complications. Symptoms are improving. There are no diabetic complications. Risk factors for coronary artery disease include diabetes mellitus, hypertension, family history, obesity and sedentary lifestyle. Her weight is increasing steadily. She is following a generally unhealthy diet. When asked about meal planning, she reported none. She has not had a previous visit with a dietitian. She never participates in exercise. Her home blood glucose trend is decreasing steadily. (She did not bring her logs nor meter with her.  2 months ago her A1c was 7.9%, improving from 11%.  She denies hypoglycemia, based on  her verbal report lowest blood glucose profile is one hundred nineteen at fasting.    ) An ACE inhibitor/angiotensin II receptor blocker is not being taken. She does not see a podiatrist.Eye exam is not current.    Review of Systems  Constitutional: Positive for fatigue. Negative for chills, fever and unexpected weight change.  HENT: Negative for trouble swallowing and voice change.   Eyes: Negative for visual disturbance.  Respiratory: Negative for cough and wheezing.   Cardiovascular: Negative for leg swelling.  Gastrointestinal: Negative for diarrhea, nausea and vomiting.  Endocrine: Negative for cold intolerance, heat intolerance, polydipsia, polyphagia and polyuria.  Musculoskeletal: Negative for arthralgias and myalgias.  Skin: Negative for color change, pallor, rash and wound.  Neurological: Negative for seizures.  Psychiatric/Behavioral: Negative for confusion and suicidal ideas.    Objective:    BP 128/90   Pulse 80   Ht '5\' 7"'  (1.702 m)   Wt (!) 326 lb 3.2 oz (148 kg)   BMI 51.09 kg/m   Wt Readings from Last 3 Encounters:  10/15/20 (!) 326 lb 3.2 oz (148 kg)  05/13/20 (!) 319 lb (144.7 kg)  05/06/20 (!) 318 lb 9.6 oz (144.5 kg)     Physical Exam Constitutional:      Appearance: She is well-developed.  HENT:     Head: Normocephalic and atraumatic.  Neck:     Thyroid: No thyromegaly.     Trachea: No tracheal deviation.  Cardiovascular:     Rate and Rhythm: Normal rate and regular rhythm.  Pulmonary:     Effort: Pulmonary effort is normal.     Breath sounds: Normal breath sounds.  Abdominal:     General: Bowel sounds are normal.     Palpations: Abdomen is soft.     Tenderness: There is no abdominal tenderness. There is no guarding.  Musculoskeletal:        General: Normal range of motion.     Cervical back: Normal range of motion and neck supple.  Skin:    General: Skin is warm and dry.     Coloration: Skin is not pale.     Findings: No erythema or rash.   Neurological:     Mental Status: She is alert and oriented to person, place, and time.     Cranial Nerves: No cranial nerve deficit.     Coordination: Coordination normal.     Deep Tendon Reflexes: Reflexes are normal and symmetric.  Psychiatric:        Judgment: Judgment normal.     CMP     Component Value Date/Time   NA 139 07/28/2020 0000   K 4.9 07/28/2020 0000   CL 103 07/28/2020 0000   CO2 31 (A) 07/28/2020 0000   GLUCOSE 307 (H)  09/25/2018 1507   BUN 15 07/28/2020 0000   CREATININE 0.8 07/28/2020 0000   CREATININE 0.84 09/25/2018 1507   CALCIUM 9.6 07/28/2020 0000   PROT 7.3 09/25/2018 1507   ALBUMIN 4.5 07/28/2020 0000   AST 11 (A) 07/28/2020 0000   ALT 12 07/28/2020 0000   ALKPHOS 107 07/28/2020 0000   BILITOT 0.4 09/25/2018 1507   GFRNONAA 98 07/28/2020 0000   GFRNONAA 93 09/25/2018 1507   GFRAA 113 07/28/2020 0000   GFRAA 108 09/25/2018 1507   Recent Results (from the past 2160 hour(s))  Basic metabolic panel     Status: Abnormal   Collection Time: 07/28/20 12:00 AM  Result Value Ref Range   Glucose 238    BUN 15 4 - 21   CO2 31 (A) 13 - 22   Creatinine 0.8 0.5 - 1.1   Potassium 4.9 3.4 - 5.3   Sodium 139 137 - 147   Chloride 103 99 - 108  Comprehensive metabolic panel     Status: None   Collection Time: 07/28/20 12:00 AM  Result Value Ref Range   Globulin 2.7    GFR calc Af Amer 113    GFR calc non Af Amer 98    Calcium 9.6 8.7 - 10.7   Albumin 4.5 3.5 - 5.0  Lipid panel     Status: None   Collection Time: 07/28/20 12:00 AM  Result Value Ref Range   Triglycerides 78 40 - 160   Cholesterol 193 0 - 200   HDL 59 35 - 70   LDL Cholesterol 117   Hepatic function panel     Status: Abnormal   Collection Time: 07/28/20 12:00 AM  Result Value Ref Range   Alkaline Phosphatase 107 25 - 125   ALT 12 7 - 35   AST 11 (A) 13 - 35   Bilirubin, Total 0.5   Hemoglobin A1c     Status: None   Collection Time: 07/28/20 12:00 AM  Result Value Ref Range    Hemoglobin A1C 7.9   TSH     Status: None   Collection Time: 07/28/20 12:00 AM  Result Value Ref Range   TSH 2.00 0.41 - 5.90      Assessment & Plan:   1. Type 2 diabetes mellitus without complication, without long-term current use of insulin ([facility])  - Ruth Campbell has currently uncontrolled symptomatic type 2 DM since 35 years of age. She did not bring her logs nor meter with her.  2 months ago her A1c was 7.9%, improving from 11%.  She denies hypoglycemia, based on her verbal report lowest blood glucose profile is one hundred nineteen at fasting.   She is responding to her recent addition of basal insulin.    She is also reportedly trying to achieve pregnancy.    -her diabetes is complicated by obesity/sedentary life and she remains at a high risk for more acute and chronic complications which include CAD, CVA, CKD, retinopathy, and neuropathy. These are all discussed in detail with her.  - I have counseled her on diet management and weight loss, by adopting a carbohydrate restricted/protein rich diet.  - she acknowledges that there is a room for improvement in her food and drink choices. - Suggestion is made for her to avoid simple carbohydrates  from her diet including Cakes, Sweet Desserts, Ice Cream, Soda (diet and regular), Sweet Tea, Candies, Chips, Cookies, Store Bought Juices, Alcohol in Excess of  1-2 drinks a day, Artificial Sweeteners,  Coffee Creamer, and "Sugar-free" Products, Lemonade. This will help patient to have more stable blood glucose profile and potentially avoid unintended weight gain.   -In light of her presentation with improved A1c, she will not need prandial insulin for now.    -She has responded to basal insulin, advised to continue Lantus 60 units nightly, associated with monitoring of blood glucose twice a day-daily before breakfast and at bedtime.    - she is encouraged to call clinic for blood glucose levels less than 70 or above 200 mg /dl.  -She  continues to tolerate Metformin, she is advised to continue metformin 1000 mg p.o. twice daily  - Patient specific target  A1c;  LDL, HDL, Triglycerides  were discussed in detail.  2) Lipids/Hyperlipidemia: She has dyslipidemia with LDL of one seventeen.   She is not on statins.  She will be considered for low-dose statin next visit.     3) morbid obesity: BMI of 51.09   -She is a candidate for modest weight loss.  In order for her to achieve control of diabetes and achieve pregnancy successfully, the single most important intervention for her would be weight loss.  Weight loss by caloric restrictions discussed in detail with her, see above.  - she is  advised to maintain close follow up with [place], [place] Dragon, FNP for primary care needs, as well as her other providers for optimal and coordinated care.  - Time spent on this patient care encounter:  40 min, of which > 50% was spent in  counseling and the rest reviewing her blood glucose logs , discussing her hypoglycemia and hyperglycemia episodes, reviewing her current and  previous labs / studies  ( including abstraction from other facilities) and medications  doses and developing a  long term treatment plan and documenting her care.   Please refer to Patient Instructions for Blood Glucose Monitoring and Insulin/Medications Dosing Guide"  in media tab for additional information. Please  also refer to " Patient Self Inventory" in the Media  tab for reviewed elements of pertinent patient history.  Ruth Campbell participated in the discussions, expressed understanding, and voiced agreement with the above plans.  All questions were answered to her satisfaction. she is encouraged to contact clinic should she have any questions or concerns prior to her return visit.   Follow up plan: - Return in about 5 weeks (around 11/19/2020) for Bring Meter and Logs- A1c in Office.  Glade Lloyd, MD [facility] [address] [place], [place] [address] Phone: [phone]  Fax: [phone]    10/15/2020, 8:57 AM  This note was partially dictated with voice recognition software. Similar sounding words can be transcribed inadequately or may not  be corrected upon review.

## 2020-10-15 NOTE — Patient Instructions (Signed)
                                     Advice for Weight Management  -For most of us the best way to lose weight is by diet management. Generally speaking, diet management means consuming less calories intentionally which over time brings about progressive weight loss.  This can be achieved more effectively by restricting carbohydrate consumption to the minimum possible.  So, it is critically important to know your numbers: how much calorie you are consuming and how much calorie you need. More importantly, our carbohydrates sources should be unprocessed or minimally processed complex starch food items.   Sometimes, it is important to balance nutrition by increasing protein intake (animal or plant source), fruits, and vegetables.  -Sticking to a routine mealtime to eat 3 meals a day and avoiding unnecessary snacks is shown to have a big role in weight control. Under normal circumstances, the only time we lose real weight is when we are hungry, so allow hunger to take place- hunger means no food between meal times, only water.  It is not advisable to starve.   -It is better to avoid simple carbohydrates including: Cakes, Sweet Desserts, Ice Cream, Soda (diet and regular), Sweet Tea, Candies, Chips, Cookies, Store Bought Juices, Alcohol in Excess of  1-2 drinks a day, Lemonade,  Artificial Sweeteners, Doughnuts, Coffee Creamers, "Sugar-free" Products, etc, etc.  This is not a complete list.....    -Consulting with certified diabetes educators is proven to provide you with the most accurate and current information on diet.  Also, you may be  interested in discussing diet options/exchanges , we can schedule a visit with Ruth Campbell, RDN, CDE for individualized nutrition education.  -Exercise: If you are able: 30 -60 minutes a day ,4 days a week, or 150 minutes a week.  The longer the better.  Combine stretch, strength, and aerobic activities.  If you were told in the  past that you have high risk for cardiovascular diseases, you may seek evaluation by your heart doctor prior to initiating moderate to intense exercise programs.                                  Additional Care Considerations for Diabetes   -Diabetes  is a chronic disease.  The most important care consideration is regular follow-up with your diabetes care provider with the goal being avoiding or delaying its complications and to take advantage of advances in medications and technology.    -Type 2 diabetes is known to coexist with other important comorbidities such as high blood pressure and high cholesterol.  It is critical to control not only the diabetes but also the high blood pressure and high cholesterol to minimize and delay the risk of complications including coronary artery disease, stroke, amputations, blindness, etc.    - Studies showed that people with diabetes will benefit from a class of medications known as ACE inhibitors and statins.  Unless there are specific reasons not to be on these medications, the standard of care is to consider getting one from these groups of medications at an optimal doses.  These medications are generally considered safe and proven to help protect the heart and the kidneys.    - People with diabetes are encouraged to initiate and maintain regular follow-up with eye doctors, foot   doctors, dentists , and if necessary heart and kidney doctors.     - It is highly recommended that people with diabetes quit smoking or stay away from smoking, and get yearly  flu vaccine and pneumonia vaccine at least every 5 years.  One other important lifestyle recommendation is to ensure adequate sleep - at least 6-7 hours of uninterrupted sleep at night.  -Exercise: If you are able: 30 -60 minutes a day, 4 days a week, or 150 minutes a week.  The longer the better.  Combine stretch, strength, and aerobic activities.  If you were told in the past that you have high risk for  cardiovascular diseases, you may seek evaluation by your heart doctor prior to initiating moderate to intense exercise programs.          

## 2020-11-19 ENCOUNTER — Ambulatory Visit (INDEPENDENT_AMBULATORY_CARE_PROVIDER_SITE_OTHER): Payer: Medicaid Other | Admitting: "Endocrinology

## 2020-11-19 ENCOUNTER — Encounter: Payer: Self-pay | Admitting: "Endocrinology

## 2020-11-19 ENCOUNTER — Other Ambulatory Visit: Payer: Self-pay

## 2020-11-19 VITALS — BP 138/88 | HR 84 | Ht 67.0 in | Wt 331.8 lb

## 2020-11-19 DIAGNOSIS — I10 Essential (primary) hypertension: Secondary | ICD-10-CM | POA: Diagnosis not present

## 2020-11-19 DIAGNOSIS — E782 Mixed hyperlipidemia: Secondary | ICD-10-CM | POA: Diagnosis not present

## 2020-11-19 DIAGNOSIS — E119 Type 2 diabetes mellitus without complications: Secondary | ICD-10-CM

## 2020-11-19 LAB — POCT GLYCOSYLATED HEMOGLOBIN (HGB A1C): HbA1c, POC (controlled diabetic range): 7.9 % — AB (ref 0.0–7.0)

## 2020-11-19 NOTE — Progress Notes (Signed)
11/19/2020, 9:21 AM   Endocrinology follow-up note   Subjective:    Patient ID: Ruth Campbell, female    DOB: [birth year].  Ruth Campbell is returning for follow-up in the management of currently uncontrolled type 2 diabetes, hypertension, weight management.    OHY:WVPXT, Lafayette Dragon, FNP.   Past Medical History:  Diagnosis Date  . Asthma   . Diabetes mellitus without complication ([place])   . Hypertension   . Obesity    History reviewed. No pertinent surgical history. Social History   Socioeconomic History  . Marital status: Single    Spouse name: Not on file  . Number of children: Not on file  . Years of education: Not on file  . Highest education level: Not on file  Occupational History  . Not on file  Tobacco Use  . Smoking status: Never Smoker  . Smokeless tobacco: Never Used  Vaping Use  . Vaping Use: Never used  Substance and Sexual Activity  . Alcohol use: No  . Drug use: Yes    Types: Marijuana    Comment: last used 2014   . Sexual activity: Yes    Birth control/protection: None  Other Topics Concern  . Not on file  Social History Narrative  . Not on file   Social Determinants of Health   Financial Resource Strain: Not on file  Food Insecurity: Not on file  Transportation Needs: Not on file  Physical Activity: Not on file  Stress: Not on file  Social Connections: Not on file   Outpatient Encounter Medications as of 11/19/2020  Medication Sig  . ACCU-CHEK GUIDE test strip USE TO TEST BLOOD SUGAR FOUR TIMES DAILY  . Blood Glucose Monitoring Suppl (ACCU-CHEK GUIDE ME) w/Device KIT 1 Piece by Does not apply route 4 (four) times daily.  Marland Kitchen GLOBAL INJECT EASE INSULIN SYR 31G X 5/16" 0.5 ML MISC Inject 1 Syringe into the skin 2 (two) times daily.  Marland Kitchen ibuprofen (ADVIL) 400 MG tablet   . insulin glargine (LANTUS SOLOSTAR) 100 UNIT/ML Solostar Pen Inject 60 Units into the skin daily.   . Insulin Pen Needle (B-D ULTRAFINE III SHORT PEN) 31G X 8 MM MISC 1 each by Does not apply route as directed.  . labetalol (NORMODYNE) 100 MG tablet Take 100 mg by mouth 2 (two) times daily.  . Lancets (ACCU-CHEK MULTICLIX) lancets Use as instructed to test BG 4 x daily. G62,69  . metFORMIN (GLUCOPHAGE) 1000 MG tablet Take 1 tablet (1,000 mg total) by mouth 2 (two) times daily with a meal.   No facility-administered encounter medications on file as of 11/19/2020.    ALLERGIES: No Known Allergies  VACCINATION STATUS: Immunization History  Administered Date(s) Administered  . Tdap 03/18/2014    Diabetes She presents for her follow-up diabetic visit. She has type 2 diabetes mellitus. Onset time: She was diagnosed at approximate age of 26 years. Her disease course has been worsening (This patient was previously seen in this clinic for management of uncontrolled type 2 diabetes, unfortunately she did not return for follow-up.). There are no hypoglycemic associated symptoms. Pertinent negatives for hypoglycemia include no confusion, pallor or seizures. Associated symptoms include fatigue.  Pertinent negatives for diabetes include no polydipsia, no polyphagia and no polyuria. There are no hypoglycemic complications. Symptoms are worsening. There are no diabetic complications. Risk factors for coronary artery disease include diabetes mellitus, hypertension, family history, obesity and sedentary lifestyle. She is compliant with treatment some of the time. Her weight is increasing steadily. She is following a generally unhealthy diet. When asked about meal planning, she reported none. She has not had a previous visit with a dietitian. She never participates in exercise. Her home blood glucose trend is increasing steadily. Her breakfast blood glucose range is generally >200 mg/dl. Her overall blood glucose range is >200 mg/dl. (She presents without her logs.  Her meter shows average blood glucose of 216 for  the last 14 days.  Her point-of-care A1c 7.9% unchanged from her last visit A1c.  She has no hypoglycemia.  She admits skipping her Lantus at least half of the time    ) An ACE inhibitor/angiotensin II receptor blocker is not being taken. She does not see a podiatrist.Eye exam is not current.    Review of Systems  Constitutional: Positive for fatigue. Negative for chills, fever and unexpected weight change.  HENT: Negative for trouble swallowing and voice change.   Eyes: Negative for visual disturbance.  Respiratory: Negative for cough and wheezing.   Cardiovascular: Negative for leg swelling.  Gastrointestinal: Negative for diarrhea, nausea and vomiting.  Endocrine: Negative for cold intolerance, heat intolerance, polydipsia, polyphagia and polyuria.  Musculoskeletal: Negative for arthralgias and myalgias.  Skin: Negative for color change, pallor, rash and wound.  Neurological: Negative for seizures.  Psychiatric/Behavioral: Negative for confusion and suicidal ideas.    Objective:    BP 138/88   Pulse 84   Ht '5\' 7"'  (1.702 m)   Wt (!) 331 lb 12.8 oz (150.5 kg)   BMI 51.97 kg/m   Wt Readings from Last 3 Encounters:  11/19/20 (!) 331 lb 12.8 oz (150.5 kg)  10/15/20 (!) 326 lb 3.2 oz (148 kg)  05/13/20 (!) 319 lb (144.7 kg)     Physical Exam Constitutional:      Appearance: She is well-developed.  HENT:     Head: Normocephalic and atraumatic.  Neck:     Thyroid: No thyromegaly.     Trachea: No tracheal deviation.  Cardiovascular:     Rate and Rhythm: Normal rate and regular rhythm.  Pulmonary:     Effort: Pulmonary effort is normal.     Breath sounds: Normal breath sounds.  Abdominal:     General: Bowel sounds are normal.     Palpations: Abdomen is soft.     Tenderness: There is no abdominal tenderness. There is no guarding.  Musculoskeletal:        General: Normal range of motion.     Cervical back: Normal range of motion and neck supple.  Skin:    General: Skin  is warm and dry.     Coloration: Skin is not pale.     Findings: No erythema or rash.  Neurological:     Mental Status: She is alert and oriented to person, place, and time.     Cranial Nerves: No cranial nerve deficit.     Coordination: Coordination normal.     Deep Tendon Reflexes: Reflexes are normal and symmetric.  Psychiatric:        Judgment: Judgment normal.     CMP     Component Value Date/Time   NA 139 07/28/2020 0000   K 4.9 07/28/2020 0000  CL 103 07/28/2020 0000   CO2 31 (A) 07/28/2020 0000   GLUCOSE 307 (H) 09/25/2018 1507   BUN 15 07/28/2020 0000   CREATININE 0.8 07/28/2020 0000   CREATININE 0.84 09/25/2018 1507   CALCIUM 9.6 07/28/2020 0000   PROT 7.3 09/25/2018 1507   ALBUMIN 4.5 07/28/2020 0000   AST 11 (A) 07/28/2020 0000   ALT 12 07/28/2020 0000   ALKPHOS 107 07/28/2020 0000   BILITOT 0.4 09/25/2018 1507   GFRNONAA 98 07/28/2020 0000   GFRNONAA 93 09/25/2018 1507   GFRAA 113 07/28/2020 0000   GFRAA 108 09/25/2018 1507   Recent Results (from the past 2160 hour(s))  HgB A1c     Status: Abnormal   Collection Time: 11/19/20  9:12 AM  Result Value Ref Range   Hemoglobin A1C     HbA1c POC (<> result, manual entry)     HbA1c, POC (prediabetic range)     HbA1c, POC (controlled diabetic range) 7.9 (A) 0.0 - 7.0 %   Lipid Panel     Component Value Date/Time   CHOL 193 07/28/2020 0000   TRIG 78 07/28/2020 0000   HDL 59 07/28/2020 0000   LDLCALC 117 07/28/2020 0000     Assessment & Plan:   1. Type 2 diabetes mellitus without complication, without long-term current use of insulin (HCC)  - Ruth Campbell has currently uncontrolled symptomatic type 2 DM since 35 years of age. She presents without her logs.  Her meter shows average blood glucose of 216 for the last 14 days.  Her point-of-care A1c 7.9% unchanged from her last visit A1c.  She has no hypoglycemia.  She admits skipping her Lantus at least half of the time.  -her diabetes is complicated  by obesity/sedentary life and she remains at a high risk for more acute and chronic complications which include CAD, CVA, CKD, retinopathy, and neuropathy. These are all discussed in detail with her.  - I have counseled her on diet management and weight loss, by adopting a carbohydrate restricted/protein rich diet.  - she acknowledges that there is a room for improvement in her food and drink choices. - Suggestion is made for her to avoid simple carbohydrates  from her diet including Cakes, Sweet Desserts, Ice Cream, Soda (diet and regular), Sweet Tea, Candies, Chips, Cookies, Store Bought Juices, Alcohol in Excess of  1-2 drinks a day, Artificial Sweeteners,  Coffee Creamer, and "Sugar-free" Products, Lemonade. This will help patient to have more stable blood glucose profile and potentially avoid unintended weight gain.   -In light of her presentation with uncontrolled hyperglycemia, she will need a higher dose of basal insulin in the consistent administration pattern.  I had a long discussion about consistency and compliance with her.   -She is advised to increase her Lantus to 70 units nightly,    associated with monitoring of blood glucose twice a day-daily before breakfast and at bedtime.    - she is encouraged to call clinic for blood glucose levels less than 70 or above 200 mg /dl.  -She continues to tolerate Metformin, she is advised to continue metformin 1000 mg p.o. twice daily. -If she maximizes her basal insulin, she will be considered for Trulicity weekly.  - Patient specific target  A1c;  LDL, HDL, Triglycerides  were discussed in detail.  2) Lipids/Hyperlipidemia: She has dyslipidemia with LDL of 117.  Patient will need fasting lipid panel on subsequent visits, may benefit from early initiation of statin.    3) morbid obesity:  BMI of 51.97    -She is a candidate for modest weight loss.  In order for her to achieve control of diabetes and achieve pregnancy successfully, the single  most important intervention for her would be weight loss.  Weight loss by caloric restrictions discussed in detail with her, see above.  - she is  advised to maintain close follow up with [place], Lafayette Dragon, FNP for primary care needs, as well as her other providers for optimal and coordinated care.  - Time spent on this patient care encounter:  40 min, of which > 50% was spent in  counseling and the rest reviewing her blood glucose logs , discussing her hypoglycemia and hyperglycemia episodes, reviewing her current and  previous labs / studies  ( including abstraction from other facilities) and medications  doses and developing a  long term treatment plan and documenting her care.   Please refer to Patient Instructions for Blood Glucose Monitoring and Insulin/Medications Dosing Guide"  in media tab for additional information. Please  also refer to " Patient Self Inventory" in the Media  tab for reviewed elements of pertinent patient history.  Ruth Campbell participated in the discussions, expressed understanding, and voiced agreement with the above plans.  All questions were answered to her satisfaction. she is encouraged to contact clinic should she have any questions or concerns prior to her return visit.   Follow up plan: - Return in about 3 months (around 02/19/2021) for F/U with Pre-visit Labs, Meter, Logs, A1c here.Glade Lloyd, MD [facility] [address] [place], [place] [address] Phone: [phone]  Fax: [phone]    11/19/2020, 9:21 AM  This note was partially dictated with voice recognition software. Similar sounding words can be transcribed inadequately or may not  be corrected upon review.

## 2020-11-19 NOTE — Patient Instructions (Signed)

## 2020-12-28 ENCOUNTER — Other Ambulatory Visit: Payer: Self-pay | Admitting: "Endocrinology

## 2021-02-19 LAB — COMPREHENSIVE METABOLIC PANEL
ALT: 16 IU/L (ref 0–32)
AST: 11 IU/L (ref 0–40)
Albumin/Globulin Ratio: 2 (ref 1.2–2.2)
Albumin: 4.3 g/dL (ref 3.8–4.8)
Alkaline Phosphatase: 122 IU/L — ABNORMAL HIGH (ref 44–121)
BUN/Creatinine Ratio: 11 (ref 9–23)
BUN: 9 mg/dL (ref 6–20)
Bilirubin Total: 0.5 mg/dL (ref 0.0–1.2)
CO2: 25 mmol/L (ref 20–29)
Calcium: 9 mg/dL (ref 8.7–10.2)
Chloride: 101 mmol/L (ref 96–106)
Creatinine, Ser: 0.79 mg/dL (ref 0.57–1.00)
Globulin, Total: 2.1 g/dL (ref 1.5–4.5)
Glucose: 357 mg/dL — ABNORMAL HIGH (ref 65–99)
Potassium: 4.5 mmol/L (ref 3.5–5.2)
Sodium: 140 mmol/L (ref 134–144)
Total Protein: 6.4 g/dL (ref 6.0–8.5)
eGFR: 101 mL/min/{1.73_m2} (ref >59)

## 2021-02-21 ENCOUNTER — Ambulatory Visit (INDEPENDENT_AMBULATORY_CARE_PROVIDER_SITE_OTHER): Payer: Medicaid Other | Admitting: "Endocrinology

## 2021-02-21 ENCOUNTER — Other Ambulatory Visit: Payer: Self-pay

## 2021-02-21 ENCOUNTER — Encounter: Payer: Self-pay | Admitting: "Endocrinology

## 2021-02-21 VITALS — BP 136/92 | HR 72 | Ht 67.0 in | Wt 330.0 lb

## 2021-02-21 DIAGNOSIS — Z91199 Patient's noncompliance with other medical treatment and regimen due to unspecified reason: Secondary | ICD-10-CM | POA: Insufficient documentation

## 2021-02-21 DIAGNOSIS — E782 Mixed hyperlipidemia: Secondary | ICD-10-CM

## 2021-02-21 DIAGNOSIS — I10 Essential (primary) hypertension: Secondary | ICD-10-CM | POA: Diagnosis not present

## 2021-02-21 DIAGNOSIS — E119 Type 2 diabetes mellitus without complications: Secondary | ICD-10-CM

## 2021-02-21 DIAGNOSIS — Z9119 Patient's noncompliance with other medical treatment and regimen: Secondary | ICD-10-CM

## 2021-02-21 LAB — POCT GLYCOSYLATED HEMOGLOBIN (HGB A1C): HbA1c, POC (controlled diabetic range): 9 % — AB (ref 0.0–7.0)

## 2021-02-21 MED ORDER — ACCU-CHEK GUIDE VI STRP: ORAL_STRIP | 1 refills | Status: AC

## 2021-02-21 MED ORDER — LANTUS SOLOSTAR 100 UNIT/ML ~~LOC~~ SOPN: 80.0000 [IU] | PEN_INJECTOR | Freq: Every day | SUBCUTANEOUS | 1 refills | Status: DC

## 2021-02-21 MED ORDER — ACCU-CHEK GUIDE ME W/DEVICE KIT: 1.0000 | PACK | Freq: Four times a day (QID) | 0 refills | Status: DC

## 2021-02-21 MED ORDER — TRULICITY 0.75 MG/0.5ML ~~LOC~~ SOAJ: 0.7500 mg | SUBCUTANEOUS | 2 refills | Status: DC

## 2021-02-21 NOTE — Progress Notes (Signed)
02/21/2021, 11:31 AM   Endocrinology follow-up note   Subjective:    Patient ID: Ruth Campbell, female    DOB: 04/24/1986.  Ruth Campbell is returning for follow-up in the management of currently uncontrolled type 2 diabetes, hypertension, weight management.    UDJ:SHFWY, Lafayette Dragon, FNP.   Past Medical History:  Diagnosis Date   Asthma    Diabetes mellitus without complication ([place])    Hypertension    Obesity    History reviewed. No pertinent surgical history. Social History   Socioeconomic History   Marital status: Single    Spouse name: Not on file   Number of children: Not on file   Years of education: Not on file   Highest education level: Not on file  Occupational History   Not on file  Tobacco Use   Smoking status: Never   Smokeless tobacco: Never  Vaping Use   Vaping Use: Never used  Substance and Sexual Activity   Alcohol use: No   Drug use: Yes    Types: Marijuana    Comment: last used 2014    Sexual activity: Yes    Birth control/protection: None  Other Topics Concern   Not on file  Social History Narrative   Not on file   Social Determinants of Health   Financial Resource Strain: Not on file  Food Insecurity: Not on file  Transportation Needs: Not on file  Physical Activity: Not on file  Stress: Not on file  Social Connections: Not on file   Outpatient Encounter Medications as of 02/21/2021  Medication Sig   Dulaglutide (TRULICITY) 6.37 CH/8.8FO SOPN Inject 0.75 mg into the skin once a week.   Blood Glucose Monitoring Suppl (ACCU-CHEK GUIDE ME) w/Device KIT 1 Piece by Does not apply route 4 (four) times daily.   GLOBAL INJECT EASE INSULIN SYR 31G X 5/16" 0.5 ML MISC Inject 1 Syringe into the skin 2 (two) times daily.   glucose blood (ACCU-CHEK GUIDE) test strip USE TO TEST BLOOD SUGAR FOUR TIMES DAILY   ibuprofen (ADVIL) 400 MG tablet    insulin glargine (LANTUS  SOLOSTAR) 100 UNIT/ML Solostar Pen Inject 80 Units into the skin at bedtime.   Insulin Pen Needle (B-D ULTRAFINE III SHORT PEN) 31G X 8 MM MISC 1 each by Does not apply route as directed.   labetalol (NORMODYNE) 100 MG tablet Take 100 mg by mouth 2 (two) times daily.   Lancets (ACCU-CHEK MULTICLIX) lancets Use as instructed to test BG 4 x daily. Y77,41   metFORMIN (GLUCOPHAGE) 1000 MG tablet Take 1 tablet (1,000 mg total) by mouth 2 (two) times daily with a meal.   [DISCONTINUED] ACCU-CHEK GUIDE test strip USE TO TEST BLOOD SUGAR FOUR TIMES DAILY   [DISCONTINUED] Blood Glucose Monitoring Suppl (ACCU-CHEK GUIDE ME) w/Device KIT 1 Piece by Does not apply route 4 (four) times daily.   [DISCONTINUED] insulin glargine (LANTUS SOLOSTAR) 100 UNIT/ML Solostar Pen Inject 70 Units into the skin at bedtime.   No facility-administered encounter medications on file as of 02/21/2021.    ALLERGIES: No Known Allergies  VACCINATION STATUS: Immunization History  Administered Date(s) Administered   Tdap 03/18/2014  Diabetes She presents for her follow-up diabetic visit. She has type 2 diabetes mellitus. Onset time: She was diagnosed at approximate age of 33 years. Her disease course has been worsening (This patient was previously seen in this clinic for management of uncontrolled type 2 diabetes, unfortunately she did not return for follow-up.). There are no hypoglycemic associated symptoms. Pertinent negatives for hypoglycemia include no confusion, pallor or seizures. Associated symptoms include fatigue. Pertinent negatives for diabetes include no polydipsia, no polyphagia and no polyuria. There are no hypoglycemic complications. Symptoms are worsening. There are no diabetic complications. Risk factors for coronary artery disease include diabetes mellitus, hypertension, family history, obesity and sedentary lifestyle. She is compliant with treatment some of the time. Her weight is increasing steadily. She is  following a generally unhealthy diet. When asked about meal planning, she reported none. She has not had a previous visit with a dietitian. She never participates in exercise. Her home blood glucose trend is increasing steadily. (She presents with no meter nor logs.  Her point-of-care A1c is 9%, increasing from 7.9%.  She claims that she stayed on Lantus 70 units nightly along with her metformin.  She denies hypoglycemia.      ) An ACE inhibitor/angiotensin II receptor blocker is not being taken. She does not see a podiatrist.Eye exam is not current.   Review of Systems  Constitutional:  Positive for fatigue. Negative for chills, fever and unexpected weight change.  HENT:  Negative for trouble swallowing and voice change.   Eyes:  Negative for visual disturbance.  Respiratory:  Negative for cough and wheezing.   Cardiovascular:  Negative for leg swelling.  Gastrointestinal:  Negative for diarrhea, nausea and vomiting.  Endocrine: Negative for cold intolerance, heat intolerance, polydipsia, polyphagia and polyuria.  Musculoskeletal:  Negative for arthralgias and myalgias.  Skin:  Negative for color change, pallor, rash and wound.  Neurological:  Negative for seizures.  Psychiatric/Behavioral:  Negative for confusion and suicidal ideas.    Objective:    BP (!) 136/92   Pulse 72   Ht _0  (1.702 m)   Wt (!) 330 lb (149.7 kg)   BMI 51.69 kg/m   Wt Readings from Last 3 Encounters:  02/21/21 (!) 330 lb (149.7 kg)  11/19/20 (!) 331 lb 12.8 oz (150.5 kg)  10/15/20 (!) 326 lb 3.2 oz (148 kg)     Physical Exam Constitutional:      Appearance: She is well-developed.  HENT:     Head: Normocephalic and atraumatic.  Neck:     Thyroid: No thyromegaly.     Trachea: No tracheal deviation.  Cardiovascular:     Rate and Rhythm: Normal rate and regular rhythm.  Pulmonary:     Effort: Pulmonary effort is normal.     Breath sounds: Normal breath sounds.  Abdominal:     General: Bowel sounds  are normal.     Palpations: Abdomen is soft.     Tenderness: There is no abdominal tenderness. There is no guarding.  Musculoskeletal:        General: Normal range of motion.     Cervical back: Normal range of motion and neck supple.  Skin:    General: Skin is warm and dry.     Coloration: Skin is not pale.     Findings: No erythema or rash.  Neurological:     Mental Status: She is alert and oriented to person, place, and time.     Cranial Nerves: No cranial nerve deficit.  Coordination: Coordination normal.     Deep Tendon Reflexes: Reflexes are normal and symmetric.  Psychiatric:        Judgment: Judgment normal.    CMP     Component Value Date/Time   NA 140 02/18/2021 1034   K 4.5 02/18/2021 1034   CL 101 02/18/2021 1034   CO2 25 02/18/2021 1034   GLUCOSE 357 (H) 02/18/2021 1034   GLUCOSE 307 (H) 09/25/2018 1507   BUN 9 02/18/2021 1034   CREATININE 0.79 02/18/2021 1034   CREATININE 0.84 09/25/2018 1507   CALCIUM 9.0 02/18/2021 1034   PROT 6.4 02/18/2021 1034   ALBUMIN 4.3 02/18/2021 1034   AST 11 02/18/2021 1034   ALT 16 02/18/2021 1034   ALKPHOS 122 (H) 02/18/2021 1034   BILITOT 0.5 02/18/2021 1034   GFRNONAA 98 07/28/2020 0000   GFRNONAA 93 09/25/2018 1507   GFRAA 113 07/28/2020 0000   GFRAA 108 09/25/2018 1507   Recent Results (from the past 2160 hour(s))  Comprehensive metabolic panel     Status: Abnormal   Collection Time: 02/18/21 10:34 AM  Result Value Ref Range   Glucose 357 (H) 65 - 99 mg/dL   BUN 9 6 - 20 mg/dL   Creatinine, Ser 0.79 0.57 - 1.00 mg/dL   eGFR 101 >59 mL/min/1.73   BUN/Creatinine Ratio 11 9 - 23   Sodium 140 134 - 144 mmol/L   Potassium 4.5 3.5 - 5.2 mmol/L   Chloride 101 96 - 106 mmol/L   CO2 25 20 - 29 mmol/L   Calcium 9.0 8.7 - 10.2 mg/dL   Total Protein 6.4 6.0 - 8.5 g/dL   Albumin 4.3 3.8 - 4.8 g/dL   Globulin, Total 2.1 1.5 - 4.5 g/dL   Albumin/Globulin Ratio 2.0 1.2 - 2.2   Bilirubin Total 0.5 0.0 - 1.2 mg/dL    Alkaline Phosphatase 122 (H) 44 - 121 IU/L   AST 11 0 - 40 IU/L   ALT 16 0 - 32 IU/L  HgB A1c     Status: Abnormal   Collection Time: 02/21/21  9:02 AM  Result Value Ref Range   Hemoglobin A1C     HbA1c POC (<> result, manual entry)     HbA1c, POC (prediabetic range)     HbA1c, POC (controlled diabetic range) 9.0 (A) 0.0 - 7.0 %   Lipid Panel     Component Value Date/Time   CHOL 193 07/28/2020 0000   TRIG 78 07/28/2020 0000   HDL 59 07/28/2020 0000   LDLCALC 117 07/28/2020 0000     Assessment & Plan:   1. Type 2 diabetes mellitus without complication, without long-term current use of insulin (HCC)  - Ruth Campbell has currently uncontrolled symptomatic type 2 DM since 35 years of age.  She presents with no meter nor logs.  Her point-of-care A1c is 9%, increasing from 7.9%.  She claims that she stayed on Lantus 70 units nightly along with her metformin.  She denies hypoglycemia.     -her diabetes is complicated by obesity/sedentary life and she remains at a high risk for more acute and chronic complications which include CAD, CVA, CKD, retinopathy, and neuropathy. These are all discussed in detail with her.  - I have counseled her on diet management and weight loss, by adopting a carbohydrate restricted/protein rich diet.  - she acknowledges that there is a room for improvement in her food and drink choices. - Suggestion is made for her to avoid simple carbohydrates  from her  diet including Cakes, Sweet Desserts, Ice Cream, Soda (diet and regular), Sweet Tea, Candies, Chips, Cookies, Store Bought Juices, Alcohol in Excess of  1-2 drinks a day, Artificial Sweeteners,  Coffee Creamer, and "Sugar-free" Products, Lemonade. This will help patient to have more stable blood glucose profile and potentially avoid unintended weight gain.   -In light of her presentation with uncontrolled glycemia with higher A1c of 9%, she will likely need multiple daily injections of insulin when she  commits for proper monitoring for optimization of her insulin doses.    -In the meantime, I advised her to increase her Lantus to 80 units nightly, start monitoring blood glucose at least twice a day-daily before breakfast and at bedtime.  I ordered new set of testing supplies for her.     - she is encouraged to call clinic for blood glucose levels less than 70 or above 200 mg /dl.  -She continues to tolerate Metformin, she is advised to continue metformin 1000 mg p.o. twice daily.   -She may benefit from GLP-1 receptor agonist.  I discussed and added Trulicity 7.12 mg subcutaneously weekly.  This medication will be advanced if she tolerates.    - Patient specific target  A1c;  LDL, HDL, Triglycerides  were discussed in detail.  2) Lipids/Hyperlipidemia: She has chronic dyslipidemia with recent fasting lipid panel showing LDL at 117.  She will benefit from early initiation of low-dose statin.  Her next labs will include facility panel.    3) morbid obesity: Her current BMI is 51.69. -She is a candidate for modest weight loss.  She has not made any efforts to lose weight personally.  In order for her to achieve control of diabetes and achieve pregnancy successfully, the single most important intervention for her would be weight loss.  Weight loss by caloric restrictions discussed in detail with her, see above.  - she is  advised to maintain close follow up with [facility], Lafayette Dragon, FNP for primary care needs, as well as her other providers for optimal and coordinated care.  I spent 44 minutes in the care of the patient today including review of labs from [place], Lipids, Thyroid Function, Hematology (current and previous including abstractions from other facilities); face-to-face time discussing  her blood glucose readings/logs, discussing hypoglycemia and hyperglycemia episodes and symptoms, medications doses, her options of short and long term treatment based on the latest standards of care / guidelines;   discussion about incorporating lifestyle medicine;  and documenting the encounter.    Please refer to Patient Instructions for Blood Glucose Monitoring and Insulin/Medications Dosing Guide"  in media tab for additional information. Please  also refer to " Patient Self Inventory" in the Media  tab for reviewed elements of pertinent patient history.  Ruth Campbell participated in the discussions, expressed understanding, and voiced agreement with the above plans.  All questions were answered to her satisfaction. she is encouraged to contact clinic should she have any questions or concerns prior to her return visit.]    Follow up plan: - Return in about 3 months (around 05/24/2021) for Bring Meter and Logs- A1c in Office.  Glade Lloyd, MD [facility] [address] [place], [place] [address] Phone: [phone]  Fax: [phone]    02/21/2021, 11:31 AM  This note was partially dictated with voice recognition software. Similar sounding words can be transcribed inadequately or may not  be corrected upon review.

## 2021-02-21 NOTE — Patient Instructions (Signed)

## 2021-03-02 ENCOUNTER — Telehealth: Payer: Self-pay | Admitting: Nurse Practitioner

## 2021-03-02 MED ORDER — ACCU-CHEK GUIDE ME W/DEVICE KIT: 1.0000 | PACK | Freq: Four times a day (QID) | 0 refills | Status: AC

## 2021-03-02 NOTE — Telephone Encounter (Signed)
Pt calling and states that the pharmacy does not have the meter that was called in, she needs it sent to another pharmacy below.  Blood Glucose Monitoring Suppl (ACCU-CHEK GUIDE ME) w/Device KIT  [facility] [phone] - [place], [place] AT [place] Phone:  [phone]  Fax:  [phone]

## 2021-05-25 ENCOUNTER — Ambulatory Visit: Payer: Medicaid Other | Admitting: "Endocrinology

## 2021-06-26 ENCOUNTER — Other Ambulatory Visit: Payer: Self-pay | Admitting: "Endocrinology

## 2021-08-15 ENCOUNTER — Other Ambulatory Visit: Payer: Self-pay | Admitting: "Endocrinology

## 2021-08-18 ENCOUNTER — Encounter (HOSPITAL_COMMUNITY): Payer: Self-pay | Admitting: Emergency Medicine

## 2021-08-18 ENCOUNTER — Emergency Department (HOSPITAL_COMMUNITY)
Admission: EM | Admit: 2021-08-18 | Discharge: 2021-08-18 | Disposition: A | Discharge status: Home / Self Care | Payer: Medicaid Other | Attending: Emergency Medicine | Admitting: Emergency Medicine

## 2021-08-18 DIAGNOSIS — J45909 Unspecified asthma, uncomplicated: Secondary | ICD-10-CM | POA: Insufficient documentation

## 2021-08-18 DIAGNOSIS — H9222 Otorrhagia, left ear: Secondary | ICD-10-CM | POA: Diagnosis not present

## 2021-08-18 DIAGNOSIS — I10 Essential (primary) hypertension: Secondary | ICD-10-CM | POA: Diagnosis not present

## 2021-08-18 DIAGNOSIS — H9203 Otalgia, bilateral: Secondary | ICD-10-CM | POA: Diagnosis present

## 2021-08-18 DIAGNOSIS — E119 Type 2 diabetes mellitus without complications: Secondary | ICD-10-CM | POA: Diagnosis not present

## 2021-08-18 MED ORDER — AMOXICILLIN 500 MG PO CAPS: 500.0000 mg | ORAL_CAPSULE | Freq: Three times a day (TID) | ORAL | 0 refills | Status: AC

## 2021-08-18 NOTE — Discharge Instructions (Signed)
You were evaluated in the Emergency Department and after careful evaluation, we did not find any emergent condition requiring admission or further testing in the hospital.  Your exam/testing today was overall reassuring.  Symptoms likely due to an ear infection with rupture of the eardrum.  Take the amoxicillin antibiotic as directed.  If you experience persistent ear problems such as pain or bleeding or hearing issues after 2 weeks, would recommend follow-up with the ear nose and throat specialist.  Recommend Tylenol or Motrin at home for pain.  Please return to the Emergency Department if you experience any worsening of your condition.  Thank you for allowing [place] to be a part of your care.

## 2021-08-18 NOTE — ED Triage Notes (Signed)
Pt c/o bilateral ear pain for the past 3 days. Pt c/o left sided ear bleeding this morning.

## 2021-08-18 NOTE — ED Provider Notes (Signed)
[facility]-EMERGENCY DEPT [facility] Emergency Department Provider Note MRN:  [id number]  Arrival date & time: 08/18/21     Chief Complaint   Ear Pain   History of Present Illness   Ruth Campbell is a 35 y.o. year-old female with a history of hypertension, diabetes presenting to the ED with chief complaint of ear pain.  Cough persistently for the past 2 or 3 days.  Dry.  Bilateral ear pain for the past day or so.  This morning with bleeding from the left ear, left ear seems to be hurting more than the right now.  Denies fever, no hearing loss, no headache or neck pain, no chest pain or shortness of breath, no abdominal pain.  Symptoms mild, constant, no exacerbating or alleviating factors.  Denies head trauma.  No foreign bodies in the ear.  Review of Systems  A complete 10 system review of systems was obtained and all systems are negative except as noted in the HPI and PMH.   Patient's Health History    Past Medical History:  Diagnosis Date   Asthma    Diabetes mellitus without complication (HCC)    Hypertension    Obesity     History reviewed. No pertinent surgical history.  Family History  Problem Relation Age of Onset   Cancer Paternal Grandmother        bone   Cancer Father        prostate   Hypertension Father    Hypertension Mother    Heart disease Mother     Social History   Socioeconomic History   Marital status: Single    Spouse name: Not on file   Number of children: Not on file   Years of education: Not on file   Highest education level: Not on file  Occupational History   Not on file  Tobacco Use   Smoking status: Never   Smokeless tobacco: Never  Vaping Use   Vaping Use: Never used  Substance and Sexual Activity   Alcohol use: No   Drug use: Yes    Types: Marijuana    Comment: last used 2014    Sexual activity: Yes    Birth control/protection: None  Other Topics Concern   Not on file  Social History Narrative   Not on file   Social  Determinants of Health   Financial Resource Strain: Not on file  Food Insecurity: Not on file  Transportation Needs: Not on file  Physical Activity: Not on file  Stress: Not on file  Social Connections: Not on file  Intimate Partner Violence: Not on file     Physical Exam   Vitals:   08/18/21 0554  BP: (!) 153/102  Pulse: 99  Resp: 20  Temp: 98.5 F (36.9 C)  SpO2: 100%    CONSTITUTIONAL: Well-appearing, NAD NEURO:  Alert and oriented x 3, no focal deficits EYES:  eyes equal and reactive ENT/NECK:  no LAD, no JVD; cerumen impaction of the right external ear canal, bright red blood noted in the left external ear canal, poor visualization of the left TM CARDIO: Regular rate, well-perfused, normal S1 and S2 PULM:  CTAB no wheezing or rhonchi GI/GU:  normal bowel sounds, non-distended, non-tender MSK/SPINE:  No gross deformities, no edema SKIN:  no rash, atraumatic PSYCH:  Appropriate speech and behavior  *Additional and/or pertinent findings included in MDM below  Diagnostic and Interventional Summary    EKG Interpretation  Date/Time:    Ventricular Rate:    PR  Interval:    QRS Duration:   QT Interval:    QTC Calculation:   R Axis:     Text Interpretation:         Labs Reviewed - No data to display  No orders to display    Medications - No data to display   Procedures  /  Critical Care Procedures  ED Course and Medical Decision Making  I have reviewed the triage vital signs, the nursing notes, and pertinent available records from the EMR.  Listed above are laboratory and imaging tests that I personally ordered, reviewed, and interpreted and then considered in my medical decision making (see below for details).  Suspect otitis media, possibly with acute TM rupture on the left given the bleeding.  Her hearing is intact and equal bilaterally, no mastoid tenderness, no tenderness with manipulation of the pinna.  Normal vital signs, no fever.  Appropriate for  discharge on antibiotics.       Elmer Sow. Pilar Plate, MD [facility] Emergency Medicine [facility] [email or web address]  Final Clinical Impressions(s) / ED Diagnoses     ICD-10-CM   1. Otorrhagia of left ear  [id number]       ED Discharge Orders          Ordered    amoxicillin (AMOXIL) 500 MG capsule  3 times daily        08/18/21 0601             Discharge Instructions Discussed with and Provided to Patient:    Discharge Instructions      You were evaluated in the Emergency Department and after careful evaluation, we did not find any emergent condition requiring admission or further testing in the hospital.  Your exam/testing today was overall reassuring.  Symptoms likely due to an ear infection with rupture of the eardrum.  Take the amoxicillin antibiotic as directed.  If you experience persistent ear problems such as pain or bleeding or hearing issues after 2 weeks, would recommend follow-up with the ear nose and throat specialist.  Recommend Tylenol or Motrin at home for pain.  Please return to the Emergency Department if you experience any worsening of your condition.  Thank you for allowing [place] to be a part of your care.        Sabas Sous, MD 08/18/21 [phone]

## 2021-09-09 ENCOUNTER — Other Ambulatory Visit: Payer: Self-pay | Admitting: "Endocrinology

## 2021-11-28 ENCOUNTER — Ambulatory Visit: Payer: Medicaid Other | Admitting: Nutrition

## 2022-01-04 ENCOUNTER — Encounter: Payer: Medicaid Other | Attending: Family Medicine | Admitting: Nutrition

## 2022-01-04 ENCOUNTER — Encounter: Payer: Self-pay | Admitting: Nutrition

## 2022-01-04 VITALS — Ht 69.0 in | Wt 321.0 lb

## 2022-01-04 DIAGNOSIS — Z713 Dietary counseling and surveillance: Secondary | ICD-10-CM | POA: Diagnosis not present

## 2022-01-04 DIAGNOSIS — E119 Type 2 diabetes mellitus without complications: Secondary | ICD-10-CM

## 2022-01-04 DIAGNOSIS — Z6841 Body Mass Index (BMI) 40.0 and over, adult: Secondary | ICD-10-CM | POA: Insufficient documentation

## 2022-01-04 DIAGNOSIS — E782 Mixed hyperlipidemia: Secondary | ICD-10-CM

## 2022-01-04 DIAGNOSIS — Z91199 Patient's noncompliance with other medical treatment and regimen due to unspecified reason: Secondary | ICD-10-CM

## 2022-01-04 DIAGNOSIS — E559 Vitamin D deficiency, unspecified: Secondary | ICD-10-CM

## 2022-01-04 DIAGNOSIS — I10 Essential (primary) hypertension: Secondary | ICD-10-CM

## 2022-01-04 NOTE — Progress Notes (Signed)
Medical Nutrition Therapy  ?Appointment Start time:  0800  Appointment End time:  0900 ? ?Primary concerns today: Dm Type 2 and Obesity  ?Referral diagnosis: E11.8 , e66.01 ?Preferred learning style: No preference.  ?Learning readiness: Ready  ? ? ?NUTRITION ASSESSMENT  ?36 yr old bfemale who has not been compliant with taking medications and keeping MD appts. She cares for her 52 yr old nephew. His mom died in car accident less than a year ago. ?    She is willing to work on Lifestyle medicine and comply with medications. Will r/s appt with Dr. Fransico Him, Endocrinologist. Needs to get refills from her PCP.  Seh notes she hasn't been taking her insulin nor her Trulicity often. ?She lost her meter and I gave her new one today to use. Needs to get strips and supplies. ?      Her diet currently is insuffient to meet her needs and contributing to elevated blood sugars, obesity, HTN . ?      PCP is now [facility] and she is willing to be more compliant with seeing PCP and Endocrinologist for her medical care. ?     Elevated liver enzymes noted. ?Anthropometrics  ?Wt Readings from Last 3 Encounters:  ?01/04/22 (!) 321 lb (145.6 kg)  ?08/18/21 (!) 320 lb (145.2 kg)  ?02/21/21 (!) 330 lb (149.7 kg)  ? ?Ht Readings from Last 3 Encounters:  ?01/04/22 5\' 9"  (1.753 m)  ?08/18/21 5\' 9"  (1.753 m)  ?02/21/21 5\' 7"  (1.702 m)  ? ?Body mass index is 47.4 kg/m?. ?@BMIFA @ ?Facility age limit for growth percentiles is 20 years. ?Facility age limit for growth percentiles is 20 years. ?  ? ?Clinical ?Medical Hx: Type 2 DM, Obesity, HTN,  ?Medications: Lantus 80 units; Truliciity weekly see chart ?Labs:  ?Lab Results  ?Component Value Date  ? HGBA1C 9.0 (A) 02/21/2021  ? ? ?  Latest Ref Rng & Units 02/18/2021  ? 10:34 AM 07/28/2020  ? 12:00 AM 09/25/2018  ?  3:07 PM  ?CMP  ?Glucose 65 - 99 mg/dL 02/23/2021    02/20/2021    ?BUN 6 - 20 mg/dL 9   15      12     ?Creatinine 0.57 - 1.00 mg/dL 14/08/2019   0.8      09/27/2018    ?Sodium 134 - 144 mmol/L 140   139      139     ?Potassium 3.5 - 5.2 mmol/L 4.5   4.9      4.1    ?Chloride 96 - 106 mmol/L 101   103      103    ?CO2 20 - 29 mmol/L 25   31      23     ?Calcium 8.7 - 10.2 mg/dL 9.0   9.6      323    ?Total Protein 6.0 - 8.5 g/dL 6.4    7.3    ?Total Bilirubin 0.0 - 1.2 mg/dL 0.5    0.4    ?Alkaline Phos 44 - 121 IU/L 122   107        ?AST 0 - 40 IU/L 11   11      21     ?ALT 0 - 32 IU/L 16   12      38    ?  ? This result is from an external source.  ? ?Lipid Panel  ?   ?Component Value Date/Time  ? CHOL 193 07/28/2020 0000  ? TRIG 78  07/28/2020 0000  ? HDL 59 07/28/2020 0000  ? LDLCALC 117 07/28/2020 0000  ? ? ?Notable Signs/Symptoms: Increased thirst, frequent urination, blurry vision and fatigue. ? ?Lifestyle & Dietary Hx ?Lives by herself and her nephew, who is 64 yrs old. ?Eating 2 meals per day. ?Tends to skip breakfast. ? ?Estimated daily fluid intake: 4-5 cups oz ?Supplements: Nne ?Sleep: 7 ?Stress / self-care: Sick family members ?Current average weekly physical activity: Walks some ? ?24-Hr Dietary Recall ?First Meal: Bacon, egg and cheese biscuits, season fries, [place] punch ?Snack:  ?Second Meal: skipped ?Snack:  ?Third Meal: 7 pm: Baked chicken,broccoli, Hawaiian punch ?Snack:  ?Beverages: Punch, Diet coke,  ? ?Estimated Energy Needs ?Calories: 1200 ?Carbohydrate: 135g ?Protein: 90g ?Fat: 33g ? ? ?NUTRITION DIAGNOSIS  ?NB-1.1 Food and nutrition-related knowledge deficit As related to Diabetes Type 2.  As evidenced by A1C 9.0%. ? ? ?NUTRITION INTERVENTION  ?Nutrition education (E-1) on the following topics:  ?Nutrition and Diabetes education provided on My Plate, CHO counting, meal planning, portion sizes, timing of meals, avoiding snacks between meals unless having a low blood sugar, target ranges for A1C and blood sugars, signs/symptoms and treatment of hyper/hypoglycemia, monitoring blood sugars, taking medications as prescribed, benefits of exercising 30 minutes per day and prevention of complications of  DM. ? ?Lifestyle Medicine ?- Whole Food, Plant Predominant Nutrition is highly recommended: Eat Plenty of vegetables, Mushrooms, fruits, Legumes, Whole Grains, Nuts, seeds in lieu of processed meats, processed snacks/pastries red meat, poultry, eggs.  ?  ?-It is better to avoid simple carbohydrates including: Cakes, Sweet Desserts, Ice Cream, Soda (diet and regular), Sweet Tea, Candies, Chips, Cookies, Store Bought Juices, Alcohol in Excess of  1-2 drinks a day, Lemonade,  Artificial Sweeteners, Doughnuts, Coffee Creamers, "Sugar-free" Products, etc, etc.  This is not a complete list..... ? ?Exercise: If you are able: 30 -60 minutes a day ,4 days a week, or 150 minutes a week.  The longer the better.  Combine stretch, strength, and aerobic activities.  If you were told in the past that you have high risk for cardiovascular diseases, you may seek evaluation by your heart doctor prior to initiating moderate to intense exercise programs. ? ?Handouts Provided Include  ?LIFestyle Medicine ?Meal Plan Card ?Nutrition Lifestyle ? ?Learning Style & Readiness for Change ?Teaching method utilized: Visual & Auditory  ?Demonstrated degree of understanding via: Teach Back  ?Barriers to learning/adherence to lifestyle change: none ? ?Goals Established by Pt ?Goals ? ?Eat three meals per day on time ?Don't skip meals. ?Increase fresh fruits and vegetables ?Don't eat after 7 ?Walk 30 minutes a day ?Call Dr. To get refills for insulin and strips ?  Insulin is Lantus 80 units at night ?   Take 1 metformin after breakfast and the other pill at bedtime. ?   Trucliity- take weekly on same day .75 units per week. ?Take insulin as prescribed. ?Test BS 4 times per day ? ?Get A1C down to 7% ? ? ?MONITORING & EVALUATION ?Dietary intake, weekly physical activity, and blood sugars in 1 month. ? ?Next Steps  ?Patient is to work on eating more whole plant based foods.. ? ?

## 2022-01-04 NOTE — Patient Instructions (Addendum)
Goals ? ?Eat three meals per day on time ?Don't skip meals. ?Increase fresh fruits and vegetables ?Don't eat after 7 ?Walk 30 minutes a day ?Call Dr. To get refills for insulin and strips ?  Insulin is Lantus 80 units at night ?   Take 1 metformin after breakfast and the other pill at bedtime. ?   Trucliity- take weekly on same day .75 units per week. ?Take insulin as prescribed. ?Test BS 4 times per day ? ?Get A1C down to 7% ?  ?

## 2022-01-05 ENCOUNTER — Other Ambulatory Visit: Payer: Self-pay

## 2022-01-05 DIAGNOSIS — E119 Type 2 diabetes mellitus without complications: Secondary | ICD-10-CM

## 2022-01-11 ENCOUNTER — Ambulatory Visit: Payer: Medicaid Other | Admitting: Nutrition

## 2022-01-24 ENCOUNTER — Ambulatory Visit: Payer: Medicaid Other | Admitting: "Endocrinology

## 2022-02-07 ENCOUNTER — Encounter: Payer: Self-pay | Admitting: "Endocrinology

## 2022-02-07 ENCOUNTER — Ambulatory Visit (INDEPENDENT_AMBULATORY_CARE_PROVIDER_SITE_OTHER): Payer: Medicaid Other | Admitting: "Endocrinology

## 2022-02-07 VITALS — BP 126/86 | HR 76 | Ht 67.0 in | Wt 319.0 lb

## 2022-02-07 DIAGNOSIS — E782 Mixed hyperlipidemia: Secondary | ICD-10-CM

## 2022-02-07 DIAGNOSIS — I10 Essential (primary) hypertension: Secondary | ICD-10-CM

## 2022-02-07 DIAGNOSIS — Z91199 Patient's noncompliance with other medical treatment and regimen due to unspecified reason: Secondary | ICD-10-CM

## 2022-02-07 DIAGNOSIS — E119 Type 2 diabetes mellitus without complications: Secondary | ICD-10-CM

## 2022-02-07 LAB — POCT GLYCOSYLATED HEMOGLOBIN (HGB A1C): HbA1c, POC (controlled diabetic range): 8.9 % — AB (ref 0.0–7.0)

## 2022-02-07 MED ORDER — TRULICITY 1.5 MG/0.5ML ~~LOC~~ SOAJ: 1.5000 mg | SUBCUTANEOUS | 2 refills | Status: DC

## 2022-02-07 MED ORDER — LANTUS SOLOSTAR 100 UNIT/ML ~~LOC~~ SOPN
80.0000 [IU] | PEN_INJECTOR | Freq: Every day | SUBCUTANEOUS | 1 refills | Status: DC
Start: 2022-02-07 — End: 2022-10-27

## 2022-02-07 NOTE — Progress Notes (Signed)
02/07/2022, 4:54 PM   Endocrinology follow-up note   Subjective:    Patient ID: Ruth Campbell, female    DOB: 1985-10-17.  Ruth Campbell is returning for follow-up in the management of currently uncontrolled type 2 diabetes, hypertension, weight management.   She has missed her appointments for a year.  OMA:YOKHT, Lafayette Dragon, FNP.   Past Medical History:  Diagnosis Date   Asthma    Diabetes mellitus without complication ([place])    Hypertension    Obesity    History reviewed. No pertinent surgical history. Social History   Socioeconomic History   Marital status: Single    Spouse name: Not on file   Number of children: Not on file   Years of education: Not on file   Highest education level: Not on file  Occupational History   Not on file  Tobacco Use   Smoking status: Never   Smokeless tobacco: Never  Vaping Use   Vaping Use: Never used  Substance and Sexual Activity   Alcohol use: No   Drug use: Yes    Types: Marijuana    Comment: last used 2014    Sexual activity: Yes    Birth control/protection: None  Other Topics Concern   Not on file  Social History Narrative   Not on file   Social Determinants of Health   Financial Resource Strain: Not on file  Food Insecurity: Not on file  Transportation Needs: Not on file  Physical Activity: Not on file  Stress: Not on file  Social Connections: Not on file   Outpatient Encounter Medications as of 02/07/2022  Medication Sig   Dulaglutide (TRULICITY) 1.5 XH/7.4FS SOPN Inject 1.5 mg into the skin once a week.   Blood Glucose Monitoring Suppl (ACCU-CHEK GUIDE ME) w/Device KIT 1 Piece by Does not apply route 4 (four) times daily.   GLOBAL INJECT EASE INSULIN SYR 31G X 5/16" 0.5 ML MISC Inject 1 Syringe into the skin 2 (two) times daily.   glucose blood (ACCU-CHEK GUIDE) test strip USE TO TEST BLOOD SUGAR FOUR TIMES DAILY   ibuprofen (ADVIL) 400  MG tablet    insulin glargine (LANTUS SOLOSTAR) 100 UNIT/ML Solostar Pen Inject 80 Units into the skin at bedtime.   Insulin Pen Needle (B-D ULTRAFINE III SHORT PEN) 31G X 8 MM MISC 1 each by Does not apply route as directed.   labetalol (NORMODYNE) 100 MG tablet Take 100 mg by mouth 2 (two) times daily.   Lancets (ACCU-CHEK MULTICLIX) lancets Use as instructed to test BG 4 x daily. E39,53   metFORMIN (GLUCOPHAGE) 1000 MG tablet Take 1 tablet (1,000 mg total) by mouth 2 (two) times daily with a meal.   [DISCONTINUED] insulin glargine (LANTUS SOLOSTAR) 100 UNIT/ML Solostar Pen Inject 80 Units into the skin at bedtime.   [DISCONTINUED] TRULICITY 2.02 BX/4.3HW SOPN INJECT ONE pen INTO THE SKIN ONCE WEEKLY   No facility-administered encounter medications on file as of 02/07/2022.    ALLERGIES: No Known Allergies  VACCINATION STATUS: Immunization History  Administered Date(s) Administered   Tdap 03/18/2014    Diabetes She presents for her follow-up diabetic visit. She has type 2 diabetes mellitus.  Onset time: She was diagnosed at approximate age of 63 years. Her disease course has been worsening (This patient was previously seen in this clinic for management of uncontrolled type 2 diabetes, unfortunately she did not return for follow-up.). There are no hypoglycemic associated symptoms. Pertinent negatives for hypoglycemia include no confusion, pallor or seizures. Associated symptoms include fatigue. Pertinent negatives for diabetes include no polydipsia, no polyphagia and no polyuria. There are no hypoglycemic complications. Symptoms are worsening. There are no diabetic complications. Risk factors for coronary artery disease include diabetes mellitus, hypertension, family history, obesity and sedentary lifestyle. Current diabetic treatments: She is on Lantus 70 units nightly, Trulicity 1.93 mg subcutaneously weekly. She is compliant with treatment some of the time. Her weight is increasing steadily.  She is following a generally unhealthy diet. When asked about meal planning, she reported none. She has not had a previous visit with a dietitian. She never participates in exercise. Her home blood glucose trend is increasing steadily. Her overall blood glucose range is >200 mg/dl. (After missing her clinic appointment for review, she presents with a meter showing average blood Glucose of 216 for the last 14 days.  Her point-of-care A1c is 8.9%, unchanged from 9% unchanged from her last visit. She denies hypoglycemia.      ) An ACE inhibitor/angiotensin II receptor blocker is not being taken. She does not see a podiatrist.Eye exam is not current.    Review of Systems  Constitutional:  Positive for fatigue. Negative for chills, fever and unexpected weight change.  HENT:  Negative for trouble swallowing and voice change.   Eyes:  Negative for visual disturbance.  Respiratory:  Negative for cough and wheezing.   Cardiovascular:  Negative for leg swelling.  Gastrointestinal:  Negative for diarrhea, nausea and vomiting.  Endocrine: Negative for cold intolerance, heat intolerance, polydipsia, polyphagia and polyuria.  Musculoskeletal:  Negative for arthralgias and myalgias.  Skin:  Negative for color change, pallor, rash and wound.  Neurological:  Negative for seizures.  Psychiatric/Behavioral:  Negative for confusion and suicidal ideas.     Objective:    BP 126/86   Pulse 76   Ht _0  (1.702 m)   Wt (!) 319 lb (144.7 kg)   BMI 49.96 kg/m   Wt Readings from Last 3 Encounters:  02/07/22 (!) 319 lb (144.7 kg)  01/04/22 (!) 321 lb (145.6 kg)  08/18/21 (!) 320 lb (145.2 kg)       CMP     Component Value Date/Time   NA 140 02/18/2021 1034   K 4.5 02/18/2021 1034   CL 101 02/18/2021 1034   CO2 25 02/18/2021 1034   GLUCOSE 357 (H) 02/18/2021 1034   GLUCOSE 307 (H) 09/25/2018 1507   BUN 9 02/18/2021 1034   CREATININE 0.79 02/18/2021 1034   CREATININE 0.84 09/25/2018 1507   CALCIUM  9.0 02/18/2021 1034   PROT 6.4 02/18/2021 1034   ALBUMIN 4.3 02/18/2021 1034   AST 11 02/18/2021 1034   ALT 16 02/18/2021 1034   ALKPHOS 122 (H) 02/18/2021 1034   BILITOT 0.5 02/18/2021 1034   GFRNONAA 98 07/28/2020 0000   GFRNONAA 93 09/25/2018 1507   GFRAA 113 07/28/2020 0000   GFRAA 108 09/25/2018 1507   Recent Results (from the past 2160 hour(s))  HgB A1c     Status: Abnormal   Collection Time: 02/07/22  3:36 PM  Result Value Ref Range   Hemoglobin A1C     HbA1c POC (<> result, manual entry)  HbA1c, POC (prediabetic range)     HbA1c, POC (controlled diabetic range) 8.9 (A) 0.0 - 7.0 %   Lipid Panel     Component Value Date/Time   CHOL 193 07/28/2020 0000   TRIG 78 07/28/2020 0000   HDL 59 07/28/2020 0000   LDLCALC 117 07/28/2020 0000     Assessment & Plan:   1. Type 2 diabetes mellitus without complication, without long-term current use of insulin (HCC)  - Ruth Campbell has currently uncontrolled symptomatic type 2 DM since 36 years of age.  After missing her clinic appointment for review, she presents with a meter showing average blood Glucose of 216 for the last 14 days.  Her point-of-care A1c is 8.9%, unchanged from 9% unchanged from her last visit. She denies hypoglycemia.   -her diabetes is complicated by obesity/sedentary life and she remains at a high risk for more acute and chronic complications which include CAD, CVA, CKD, retinopathy, and neuropathy. These are all discussed in detail with her.  - I have counseled her on diet management and weight loss, by adopting a carbohydrate restricted/protein rich diet.  - she acknowledges that there is a room for improvement in her food and drink choices. - Suggestion is made for her to avoid simple carbohydrates  from her diet including Cakes, Sweet Desserts, Ice Cream, Soda (diet and regular), Sweet Tea, Candies, Chips, Cookies, Store Bought Juices, Alcohol in Excess of  1-2 drinks a day, Artificial Sweeteners,   Coffee Creamer, and "Sugar-free" Products, Lemonade. This will help patient to have more stable blood glucose profile and potentially avoid unintended weight gain.  -In light of her presentation with uncontrolled glycemia with higher A1c of 8.9%, she would need multiple daily injections of insulin, however patient lacks commitment for follow-up and adherence.  This is affected the optimization effort for her treatments.      -In the meantime, I advised her to increase Lantus to 80 units nightly, continue to monitor blood glucose at least twice a day-daily before breakfast and at bedtime.    - she is encouraged to call clinic for blood glucose levels less than 70 or above 200 mg /dl.  -She continues to tolerate Metformin, she is advised to continue metformin 1000 mg p.o. twice daily.   -She will benefit from higher dose of GLP-1 receptor agonist with I discussed and increased her Trulicity to 1.5 mg subcutaneously weekly.  This medication will be advanced as tolerated.       - Patient specific target  A1c;  LDL, HDL, Triglycerides  were discussed in detail.  2) Lipids/Hyperlipidemia: She has chronic dyslipidemia with recent lipid panel showing LDL of   117.  Yslipidemia with recent fasting lipid panel showing LDL at 117.  She will be considered for statin intervention during her next visit.     3) morbid obesity: Her current BMI is 49.96.  Clearly complicating her diabetes care.  -She is a candidate for modest weight loss.  She has not made any efforts to lose weight personally.   Weight loss by caloric restrictions followed by switching to whole food plant-based diet are discussed in detail with her, see above.  - she is  advised to maintain close follow up with [place], Lafayette Dragon, FNP for primary care needs, as well as her other providers for optimal and coordinated care.  I spent 41 minutes in the care of the patient today including review of labs from CMP, Lipids, Thyroid Function, Hematology  (current and previous including  abstractions from other facilities); face-to-face time discussing  her blood glucose readings/logs, discussing hypoglycemia and hyperglycemia episodes and symptoms, medications doses, her options of short and long term treatment based on the latest standards of care / guidelines;  discussion about incorporating lifestyle medicine;  and documenting the encounter.    Please refer to Patient Instructions for Blood Glucose Monitoring and Insulin/Medications Dosing Guide"  in media tab for additional information. Please  also refer to " Patient Self Inventory" in the Media  tab for reviewed elements of pertinent patient history.  Ruth Campbell participated in the discussions, expressed understanding, and voiced agreement with the above plans.  All questions were answered to her satisfaction. she is encouraged to contact clinic should she have any questions or concerns prior to her return visit.    Follow up plan: - Return in about 3 months (around 05/10/2022) for F/U with Pre-visit Labs, Meter/CGM/Logs, A1c here.  Glade Lloyd, MD [facility] [address] [place], [place] [address] Phone: [phone]  Fax: [phone]    02/07/2022, 4:54 PM  This note was partially dictated with voice recognition software. Similar sounding words can be transcribed inadequately or may not  be corrected upon review.

## 2022-02-07 NOTE — Patient Instructions (Signed)

## 2022-04-05 ENCOUNTER — Encounter (HOSPITAL_COMMUNITY): Payer: Self-pay

## 2022-04-05 ENCOUNTER — Other Ambulatory Visit: Payer: Self-pay

## 2022-04-05 ENCOUNTER — Emergency Department (HOSPITAL_COMMUNITY)
Admission: EM | Admit: 2022-04-05 | Discharge: 2022-04-05 | Disposition: A | Discharge status: Home / Self Care | Payer: Medicaid Other | Attending: Emergency Medicine | Admitting: Emergency Medicine

## 2022-04-05 DIAGNOSIS — Z794 Long term (current) use of insulin: Secondary | ICD-10-CM | POA: Diagnosis not present

## 2022-04-05 DIAGNOSIS — K029 Dental caries, unspecified: Secondary | ICD-10-CM | POA: Insufficient documentation

## 2022-04-05 DIAGNOSIS — K0889 Other specified disorders of teeth and supporting structures: Secondary | ICD-10-CM | POA: Diagnosis present

## 2022-04-05 MED ORDER — AMOXICILLIN-POT CLAVULANATE 875-125 MG PO TABS: 1.0000 | ORAL_TABLET | Freq: Two times a day (BID) | ORAL | 0 refills | Status: DC

## 2022-04-05 MED ORDER — OXYCODONE-ACETAMINOPHEN 5-325 MG PO TABS
2.0000 | ORAL_TABLET | Freq: Once | ORAL | Status: AC
  Administered 2022-04-05: 2 via ORAL
  Filled 2022-04-05: qty 2

## 2022-04-05 NOTE — Discharge Instructions (Addendum)
Please use Tylenol or ibuprofen for pain.  You may use 600 mg ibuprofen every 6 hours or 1000 mg of Tylenol every 6 hours.  You may choose to alternate between the 2.  This would be most effective.  Not to exceed 4 g of Tylenol within 24 hours.  Not to exceed 3200 mg ibuprofen 24 hours.  In addition to the above you can use Orajel.  I recommend that you call the dental resources that are provided to try to get into see a dentist as soon as you are able as getting the cavity repaired or tooth removed is the only definitive treatment for your dental pain.  In the meantime please take the antibiotics that I prescribed for the entire course.  If you have any stomach upset or diarrhea you can take some Imodium to help.  I recommend that you take them on a full stomach.

## 2022-04-05 NOTE — ED Triage Notes (Signed)
Pt states she has a hole in her right upper tooth x 1 week.

## 2022-04-05 NOTE — ED Provider Notes (Signed)
[facility] EMERGENCY DEPARTMENT Provider Note   CSN: [id number] Arrival date & time: 04/05/22  1725     History  Chief Complaint  Patient presents with   Dental Pain    Ruth Campbell is a 36 y.o. female with noncontributory past medical history presents with concern for dental pain for the last week.  Patient reports that she has a known cavity, hole in her tooth left upper side.  Nursing reports said on the right, however physical exam revealed tooth in the left upper quadrant.  Patient denies any fever, chills, she reports that she has been using ibuprofen, Tylenol with some relief.  She denies any difficulty swallowing, inability to tolerate her own saliva.  She reports that she is attempting to find a dentist at this time.   Dental Pain      Home Medications Prior to Admission medications   Medication Sig Start Date End Date Taking? Authorizing Provider  amoxicillin-clavulanate (AUGMENTIN) 875-125 MG tablet Take 1 tablet by mouth every 12 (twelve) hours. 04/05/22  Yes Ernesha Ramone H, PA-C  Blood Glucose Monitoring Suppl (ACCU-CHEK GUIDE ME) w/Device KIT 1 Piece by Does not apply route 4 (four) times daily. 03/02/21   Cassandria Anger, MD  Dulaglutide (TRULICITY) 1.5 QI/3.4VQ SOPN Inject 1.5 mg into the skin once a week. 02/07/22   Cassandria Anger, MD  GLOBAL INJECT EASE INSULIN SYR 31G X 5/16" 0.5 ML MISC Inject 1 Syringe into the skin 2 (two) times daily. 03/13/20   [provider]  glucose blood (ACCU-CHEK GUIDE) test strip USE TO TEST BLOOD SUGAR FOUR TIMES DAILY 02/21/21   Cassandria Anger, MD  ibuprofen (ADVIL) 400 MG tablet  01/12/20   [provider]  insulin glargine (LANTUS SOLOSTAR) 100 UNIT/ML Solostar Pen Inject 80 Units into the skin at bedtime. 02/07/22   Cassandria Anger, MD  Insulin Pen Needle (B-D ULTRAFINE III SHORT PEN) 31G X 8 MM MISC 1 each by Does not apply route as directed. 10/15/20   Cassandria Anger, MD   labetalol (NORMODYNE) 100 MG tablet Take 100 mg by mouth 2 (two) times daily. 04/08/18   [provider]  Lancets (ACCU-CHEK MULTICLIX) lancets Use as instructed to test BG 4 x daily. Q59,56 11/25/18   Cassandria Anger, MD  metFORMIN (GLUCOPHAGE) 1000 MG tablet Take 1 tablet (1,000 mg total) by mouth 2 (two) times daily with a meal. 11/25/18   Nida, Marella Chimes, MD      Allergies    Patient has no known allergies.    Review of Systems   Review of Systems  HENT:  Positive for dental problem.   All other systems reviewed and are negative.   Physical Exam Updated Vital Signs BP 131/86   Pulse 91   Temp 98.6 F (37 C) (Oral)   Resp 16   Ht '5\' 7"'  (1.702 m)   Wt (!) 145.6 kg   LMP  (LMP Unknown)   SpO2 97%   BMI 50.28 kg/m  Physical Exam Vitals and nursing note reviewed.  Constitutional:      General: She is not in acute distress.    Appearance: Normal appearance.  HENT:     Head: Normocephalic and atraumatic.     Mouth/Throat:      Comments: Dental cary at the above to mark location, there is a piece of broken tooth or hole in the tooth that the patient described.  She has no significant gum swelling, evidence of abscess in  the gum space.  She has no evidence of PTA, uvula deviation, swollen tonsils, she has no dysphonia, stridor, cervical lymphadenopathy or floor of mouth swelling, redness. Eyes:     General:        Right eye: No discharge.        Left eye: No discharge.  Cardiovascular:     Rate and Rhythm: Normal rate and regular rhythm.  Pulmonary:     Effort: Pulmonary effort is normal. No respiratory distress.  Musculoskeletal:        General: No deformity.  Skin:    General: Skin is warm and dry.  Neurological:     Mental Status: She is alert and oriented to person, place, and time.  Psychiatric:        Mood and Affect: Mood normal.        Behavior: Behavior normal.     ED Results / Procedures / Treatments   Labs (all labs ordered are  listed, but only abnormal results are displayed) Labs Reviewed - No data to display  EKG None  Radiology No results found.  Procedures Procedures    Medications Ordered in ED Medications  oxyCODONE-acetaminophen (PERCOCET/ROXICET) 5-325 MG per tablet 2 tablet (2 tablets Oral Given 04/05/22 1804)    ED Course/ Medical Decision Making/ A&P                           Medical Decision Making Risk Prescription drug management.    This an overall well-appearing 36 y.o. female who presents with concern for dental pain.  Physical exam reveals cavities, broken teeth in mouth at location as described in physical exam.  Patient with redness, minimal gum swelling without evidence of gum abscess, periapical abscess, PTA, uvula deviation, pharyngitis, epiglottitis, dysphonia, stridor.  Patient without difficulty swallowing.  No systemic fever, chills.  Given patient's symptoms think it is reasonable to treat with antibiotics.  Encouraged ibuprofen, Tylenol, Orajel, ice for pain control. Considered stronger pain control with Percocet while in the emergency department, do not think the patient requires an outpatient prescription.  Encouraged urgent dental follow-up, dental resource guide provided.  Provided Augmentin for antibiotic coverage.  Patient discharged in stable condition at this time, return precautions given.  Final Clinical Impression(s) / ED Diagnoses Final diagnoses:  Pain due to dental caries    Rx / DC Orders ED Discharge Orders          Ordered    amoxicillin-clavulanate (AUGMENTIN) 875-125 MG tablet  Every 12 hours        04/05/22 1805              Ruthe Roemer, [place] H, PA-C 04/05/22 1811    Daleen Bo, MD 04/06/22 [phone]

## 2022-05-10 ENCOUNTER — Ambulatory Visit: Payer: Medicaid Other | Admitting: "Endocrinology

## 2022-05-12 ENCOUNTER — Other Ambulatory Visit: Payer: Self-pay | Admitting: "Endocrinology

## 2022-07-11 ENCOUNTER — Other Ambulatory Visit: Payer: Self-pay | Admitting: "Endocrinology

## 2022-08-01 ENCOUNTER — Emergency Department (HOSPITAL_COMMUNITY)
Admission: EM | Admit: 2022-08-01 | Discharge: 2022-08-01 | Disposition: A | Discharge status: Home / Self Care | Payer: Medicaid Other | Attending: Emergency Medicine | Admitting: Emergency Medicine

## 2022-08-01 ENCOUNTER — Encounter (HOSPITAL_COMMUNITY): Payer: Self-pay | Admitting: Emergency Medicine

## 2022-08-01 ENCOUNTER — Other Ambulatory Visit: Payer: Self-pay

## 2022-08-01 DIAGNOSIS — H109 Unspecified conjunctivitis: Secondary | ICD-10-CM

## 2022-08-01 DIAGNOSIS — I10 Essential (primary) hypertension: Secondary | ICD-10-CM | POA: Insufficient documentation

## 2022-08-01 DIAGNOSIS — H1089 Other conjunctivitis: Secondary | ICD-10-CM | POA: Diagnosis not present

## 2022-08-01 DIAGNOSIS — E119 Type 2 diabetes mellitus without complications: Secondary | ICD-10-CM | POA: Insufficient documentation

## 2022-08-01 DIAGNOSIS — Z79899 Other long term (current) drug therapy: Secondary | ICD-10-CM | POA: Insufficient documentation

## 2022-08-01 DIAGNOSIS — Z7984 Long term (current) use of oral hypoglycemic drugs: Secondary | ICD-10-CM | POA: Diagnosis not present

## 2022-08-01 DIAGNOSIS — Z794 Long term (current) use of insulin: Secondary | ICD-10-CM | POA: Diagnosis not present

## 2022-08-01 DIAGNOSIS — H5789 Other specified disorders of eye and adnexa: Secondary | ICD-10-CM | POA: Diagnosis present

## 2022-08-01 MED ORDER — OFLOXACIN 0.3 % OP SOLN: 1.0000 [drp] | Freq: Four times a day (QID) | OPHTHALMIC | 0 refills | Status: AC

## 2022-08-01 MED ORDER — POLYMYXIN B-TRIMETHOPRIM 10000-0.1 UNIT/ML-% OP SOLN: 1.0000 [drp] | Freq: Four times a day (QID) | OPHTHALMIC | 0 refills | Status: DC

## 2022-08-01 NOTE — ED Provider Notes (Signed)
[facility] EMERGENCY DEPARTMENT Provider Note   CSN: [id number] Arrival date & time: 08/01/22  1421     History  Chief Complaint  Patient presents with   Eye Pain    Ruth Campbell is a 36 y.o. female with past medical history hypertension and diabetes who presents to the ED complaining of left eye redness, pain, and mucopurulent discharge since she woke up yesterday morning.  Patient denies any injury to the eye, scratching of the eye, foreign body sensation, change in vision, pain with eye movements.  She does live with her 18-year-old nephew but he does not have similar symptoms and other than that she is not exposed to any children or people outside of the home on a regular basis.  She denies any known sick contacts, history of conjunctivitis, recent antibiotic use, fever, chills, nausea, vomiting, cough, rhinorrhea, shortness of breath, sore throat, ear pain.  She has not had any symptoms to the right eye.  She does not wear contact lenses but does wear reading glasses that were prescribed at her last eye doctor visit approximately 1 year ago at [facility] in [place].       Home Medications Prior to Admission medications   Medication Sig Start Date End Date Taking? Authorizing Provider  ofloxacin (OCUFLOX) 0.3 % ophthalmic solution Place 1 drop into the left eye 4 (four) times daily for 5 days. 08/01/22 08/06/22 Yes Aava Deland L, PA-C  amoxicillin-clavulanate (AUGMENTIN) 875-125 MG tablet Take 1 tablet by mouth every 12 (twelve) hours. 04/05/22   Prosperi, Christian H, PA-C  Blood Glucose Monitoring Suppl (ACCU-CHEK GUIDE ME) w/Device KIT 1 Piece by Does not apply route 4 (four) times daily. 03/02/21   Cassandria Anger, MD  GLOBAL INJECT EASE INSULIN SYR 31G X 5/16" 0.5 ML MISC Inject 1 Syringe into the skin 2 (two) times daily. 03/13/20   [provider]  glucose blood (ACCU-CHEK GUIDE) test strip USE TO TEST BLOOD SUGAR FOUR TIMES DAILY 02/21/21   Cassandria Anger, MD   ibuprofen (ADVIL) 400 MG tablet  01/12/20   [provider]  insulin glargine (LANTUS SOLOSTAR) 100 UNIT/ML Solostar Pen Inject 80 Units into the skin at bedtime. 02/07/22   Cassandria Anger, MD  Insulin Pen Needle (B-D ULTRAFINE III SHORT PEN) 31G X 8 MM MISC 1 each by Does not apply route as directed. 10/15/20   Cassandria Anger, MD  labetalol (NORMODYNE) 100 MG tablet Take 100 mg by mouth 2 (two) times daily. 04/08/18   [provider]  Lancets (ACCU-CHEK MULTICLIX) lancets Use as instructed to test BG 4 x daily. Y09,98 11/25/18   Cassandria Anger, MD  metFORMIN (GLUCOPHAGE) 1000 MG tablet Take 1 tablet (1,000 mg total) by mouth 2 (two) times daily with a meal. 11/25/18   Nida, Marella Chimes, MD  TRULICITY 1.5 PJ/8.2NK SOPN ADMINISTER 1.5 MG UNDER THE SKIN 1 TIME A WEEK 07/12/22   Nida, Marella Chimes, MD      Allergies    Patient has no known allergies.    Review of Systems   Review of Systems  Constitutional:  Negative for activity change, appetite change, chills, diaphoresis and fever.  HENT:  Negative for congestion, ear pain, postnasal drip, rhinorrhea, sinus pressure, sinus pain, sore throat and trouble swallowing.   Eyes:  Positive for pain, discharge, redness and itching. Negative for photophobia and visual disturbance.  Respiratory:  Negative for cough, chest tightness and shortness of breath.   Cardiovascular:  Negative for chest  pain and palpitations.  Gastrointestinal:  Negative for abdominal pain, diarrhea, nausea and vomiting.  Genitourinary:  Negative for dysuria and hematuria.  Musculoskeletal:  Negative for arthralgias and back pain.  Skin:  Negative for color change and rash.  Neurological:  Negative for dizziness, seizures, syncope, speech difficulty and light-headedness.  All other systems reviewed and are negative.   Physical Exam Updated Vital Signs BP (!) 149/89 (BP Location: Right Arm)   Pulse 88   Temp 98.1 F (36.7 C) (Oral)    Resp 18   LMP  (LMP Unknown) Comment: "years"  SpO2 98%  Physical Exam Vitals and nursing note reviewed.  Constitutional:      General: She is not in acute distress.    Appearance: Normal appearance. She is not ill-appearing.  HENT:     Head: Normocephalic and atraumatic.     Nose: Nose normal. No congestion or rhinorrhea.     Mouth/Throat:     Mouth: Mucous membranes are moist.  Eyes:     General: Lids are normal. Vision grossly intact. Gaze aligned appropriately. No scleral icterus.       Right eye: No discharge.        Left eye: Discharge (minimal mucopurulent) present.    Extraocular Movements: Extraocular movements intact.     Conjunctiva/sclera:     Right eye: Right conjunctiva is not injected.     Left eye: Left conjunctiva is injected.     Pupils: Pupils are equal, round, and reactive to light.     Comments: No pain with direct or consensual pupil constriction, no pain with extraocular movements  Cardiovascular:     Rate and Rhythm: Normal rate and regular rhythm.     Heart sounds: No murmur heard. Pulmonary:     Effort: Pulmonary effort is normal.     Breath sounds: Normal breath sounds.  Musculoskeletal:        General: Normal range of motion.     Cervical back: Neck supple.  Skin:    General: Skin is warm and dry.     Capillary Refill: Capillary refill takes less than 2 seconds.  Neurological:     General: No focal deficit present.     Mental Status: She is alert and oriented to person, place, and time.     Cranial Nerves: No cranial nerve deficit.     Sensory: No sensory deficit.  Psychiatric:        Mood and Affect: Mood normal.        Behavior: Behavior normal.     ED Results / Procedures / Treatments   Labs (all labs ordered are listed, but only abnormal results are displayed) Labs Reviewed - No data to display  EKG None  Radiology No results found.  Procedures None  Medications Ordered in ED Medications - No data to display  ED Course/  Medical Decision Making/ A&P                           Medical Decision Making Problems Addressed: Bacterial conjunctivitis of left eye: acute illness or injury   This is a 36 year old female presenting with symptoms consistent with acute bacterial conjunctivitis of the left eye. Symptoms have been present for the last day with mild pain to the eye as well as erythema and mucopurulent discharge but pt has no pain with extraocular movements, pain with direct or consensual pupil constriction, visual deficits, rash, paresthesias, or neurological deficits so very  low suspicion for uveitis, acute glaucoma, corneal abrasion, herpes zoster ophthalmicus. She denies trauma to the eye and orbit is non-tender without signs of subconjunctival hemorrhage, surrounding ecchymosis, or other traumatic injury. Will treat for bacterial conjunctivitis with ofloxacin drops due to polytrim shortage. Pt instructed to follow-up with her eye doctor for reassessment within the week and more immediately for acute changes in vision, pain with eye movements, or other concerns. Also given strict return precautions to return to the ED, her PCP, or eye doctor if she develops fever, chills, neurological symptoms, or other concerns. Evaluated by supervising MD who agreed with treatment plan.          Final Clinical Impression(s) / ED Diagnoses Final diagnoses:  Bacterial conjunctivitis of left eye    Rx / DC Orders ED Discharge Orders          Ordered    trimethoprim-polymyxin b (POLYTRIM) ophthalmic solution  Every 6 hours,   Status:  Discontinued        08/01/22 1510    ofloxacin (OCUFLOX) 0.3 % ophthalmic solution  4 times daily        08/01/22 1518              Turner Daniels 08/01/22 1630    Noemi Chapel, MD 08/01/22 2025

## 2022-08-01 NOTE — Discharge Instructions (Addendum)
Thank you for letting us take care of you today.  Please use the eye drops as instructed. FILL THE OFLOXACIN ONLY.   If you notice changes in your vision, double vision, loss of vision, pain with moving your eyes, it is very important you follow-up with your eye doctor or return to ED for re-evaluation.

## 2022-08-01 NOTE — ED Triage Notes (Signed)
Pt c/o left eye redness and pain with yellow exudate x 2 days. Nad.

## 2022-10-27 ENCOUNTER — Encounter (HOSPITAL_COMMUNITY): Payer: Self-pay

## 2022-10-27 ENCOUNTER — Emergency Department (HOSPITAL_COMMUNITY)
Admission: EM | Admit: 2022-10-27 | Discharge: 2022-10-28 | Disposition: A | Discharge status: Home / Self Care | Payer: Medicaid Other | Attending: Emergency Medicine | Admitting: Emergency Medicine

## 2022-10-27 ENCOUNTER — Other Ambulatory Visit: Payer: Self-pay

## 2022-10-27 DIAGNOSIS — Z794 Long term (current) use of insulin: Secondary | ICD-10-CM | POA: Diagnosis not present

## 2022-10-27 DIAGNOSIS — E1165 Type 2 diabetes mellitus with hyperglycemia: Secondary | ICD-10-CM

## 2022-10-27 DIAGNOSIS — E119 Type 2 diabetes mellitus without complications: Secondary | ICD-10-CM | POA: Insufficient documentation

## 2022-10-27 DIAGNOSIS — I10 Essential (primary) hypertension: Secondary | ICD-10-CM | POA: Insufficient documentation

## 2022-10-27 DIAGNOSIS — J4521 Mild intermittent asthma with (acute) exacerbation: Secondary | ICD-10-CM | POA: Insufficient documentation

## 2022-10-27 DIAGNOSIS — Z79899 Other long term (current) drug therapy: Secondary | ICD-10-CM | POA: Diagnosis not present

## 2022-10-27 DIAGNOSIS — J45909 Unspecified asthma, uncomplicated: Secondary | ICD-10-CM | POA: Insufficient documentation

## 2022-10-27 DIAGNOSIS — Z7984 Long term (current) use of oral hypoglycemic drugs: Secondary | ICD-10-CM | POA: Diagnosis not present

## 2022-10-27 DIAGNOSIS — R0602 Shortness of breath: Secondary | ICD-10-CM | POA: Diagnosis present

## 2022-10-27 LAB — CBC WITH DIFFERENTIAL/PLATELET
Abs Immature Granulocytes: 0.01 10*3/uL (ref 0.00–0.07)
Basophils Absolute: 0 10*3/uL (ref 0.0–0.1)
Basophils Relative: 1 %
Eosinophils Absolute: 0.1 10*3/uL (ref 0.0–0.5)
Eosinophils Relative: 2 %
HCT: 40.3 % (ref 36.0–46.0)
Hemoglobin: 13.4 g/dL (ref 12.0–15.0)
Immature Granulocytes: 0 %
Lymphocytes Relative: 47 %
Lymphs Abs: 2.2 10*3/uL (ref 0.7–4.0)
MCH: 26.3 pg (ref 26.0–34.0)
MCHC: 33.3 g/dL (ref 30.0–36.0)
MCV: 79.2 fL — ABNORMAL LOW (ref 80.0–100.0)
Monocytes Absolute: 0.4 10*3/uL (ref 0.1–1.0)
Monocytes Relative: 9 %
Neutro Abs: 1.9 10*3/uL (ref 1.7–7.7)
Neutrophils Relative %: 41 %
Platelets: 176 10*3/uL (ref 150–400)
RBC: 5.09 MIL/uL (ref 3.87–5.11)
RDW: 12.8 % (ref 11.5–15.5)
WBC: 4.7 10*3/uL (ref 4.0–10.5)
nRBC: 0 % (ref 0.0–0.2)

## 2022-10-27 LAB — BASIC METABOLIC PANEL
Anion gap: 10 (ref 5–15)
BUN: 13 mg/dL (ref 6–20)
CO2: 23 mmol/L (ref 22–32)
Calcium: 8.8 mg/dL — ABNORMAL LOW (ref 8.9–10.3)
Chloride: 99 mmol/L (ref 98–111)
Creatinine, Ser: 0.79 mg/dL (ref 0.44–1.00)
GFR, Estimated: >60 mL/min (ref >60)
Glucose, Bld: 452 mg/dL — ABNORMAL HIGH (ref 70–99)
Potassium: 3.8 mmol/L (ref 3.5–5.1)
Sodium: 132 mmol/L — ABNORMAL LOW (ref 135–145)

## 2022-10-27 MED ORDER — LANTUS SOLOSTAR 100 UNIT/ML ~~LOC~~ SOPN: 80.0000 [IU] | PEN_INJECTOR | Freq: Every day | SUBCUTANEOUS | 0 refills | Status: AC

## 2022-10-27 MED ORDER — IPRATROPIUM-ALBUTEROL 0.5-2.5 (3) MG/3ML IN SOLN
RESPIRATORY_TRACT | Status: AC
  Filled 2022-10-27: qty 3

## 2022-10-27 MED ORDER — AEROCHAMBER Z-STAT PLUS/MEDIUM MISC
1.0000 | Freq: Once | Status: AC
  Administered 2022-10-27: 1

## 2022-10-27 MED ORDER — INSULIN GLARGINE-YFGN 100 UNIT/ML ~~LOC~~ SOLN
80.0000 [IU] | Freq: Once | SUBCUTANEOUS | Status: AC
  Administered 2022-10-27: 80 [IU] via SUBCUTANEOUS
  Filled 2022-10-27: qty 0.8

## 2022-10-27 MED ORDER — IPRATROPIUM-ALBUTEROL 0.5-2.5 (3) MG/3ML IN SOLN
3.0000 mL | Freq: Once | RESPIRATORY_TRACT | Status: AC
  Administered 2022-10-27: 3 mL via RESPIRATORY_TRACT

## 2022-10-27 MED ORDER — METHYLPREDNISOLONE SODIUM SUCC 125 MG IJ SOLR
125.0000 mg | Freq: Once | INTRAMUSCULAR | Status: AC
  Administered 2022-10-27: 125 mg via INTRAVENOUS
  Filled 2022-10-27: qty 2

## 2022-10-27 MED ORDER — ALBUTEROL SULFATE HFA 108 (90 BASE) MCG/ACT IN AERS
1.0000 | INHALATION_SPRAY | RESPIRATORY_TRACT | Status: DC | PRN
  Administered 2022-10-27: 2 via RESPIRATORY_TRACT
  Filled 2022-10-27: qty 6.7

## 2022-10-27 MED ORDER — ALBUTEROL SULFATE (2.5 MG/3ML) 0.083% IN NEBU
INHALATION_SOLUTION | RESPIRATORY_TRACT | Status: AC
  Administered 2022-10-27: 2.5 mg
  Filled 2022-10-27: qty 3

## 2022-10-27 NOTE — ED Triage Notes (Signed)
Pt to ED from home, POV with c/o asthma flare up that started tonight, pt is out of inhaler, pt saw Dr first of February and dx with sinus infection but never took anbx that were prescribed. Pt is out of albuterol inhaler.

## 2022-10-27 NOTE — ED Provider Notes (Signed)
[facility] Provider Note   CSN: [id number] Arrival date & time: 10/27/22  2158     History  Chief Complaint  Patient presents with   Asthma    Ruth Campbell is a 37 y.o. female.  Pt is a 37 yo female with pmhx significant for asthma, htn, dm, and obesity.  She developed sob this evening.  She is out of her inhaler.  She denies f/c.       Home Medications Prior to Admission medications   Medication Sig Start Date End Date Taking? Authorizing Provider  amLODipine (NORVASC) 10 MG tablet Take 10 mg by mouth daily.    [provider]  amoxicillin (AMOXIL) 500 MG capsule 1 capsule Orally Three times a day    [provider]  amoxicillin-clavulanate (AUGMENTIN) 875-125 MG tablet Take 1 tablet by mouth every 12 (twelve) hours. 04/05/22   Prosperi, Christian H, PA-C  Blood Glucose Monitoring Suppl (ACCU-CHEK GUIDE ME) w/Device KIT 1 Piece by Does not apply route 4 (four) times daily. 03/02/21   Cassandria Anger, MD  GLOBAL INJECT EASE INSULIN SYR 31G X 5/16" 0.5 ML MISC Inject 1 Syringe into the skin 2 (two) times daily. 03/13/20   [provider]  glucose blood (ACCU-CHEK GUIDE) test strip USE TO TEST BLOOD SUGAR FOUR TIMES DAILY 02/21/21   Cassandria Anger, MD  ibuprofen (ADVIL) 400 MG tablet  01/12/20   [provider]  ibuprofen (ADVIL) 800 MG tablet one tablet By Mouth three times daily prn for 30 days    [provider]  insulin glargine (LANTUS SOLOSTAR) 100 UNIT/ML Solostar Pen Inject 80 Units into the skin at bedtime. 10/27/22   Isla Pence, MD  Insulin Pen Needle (B-D ULTRAFINE III SHORT PEN) 31G X 8 MM MISC 1 each by Does not apply route as directed. 10/15/20   Cassandria Anger, MD  labetalol (NORMODYNE) 100 MG tablet Take 100 mg by mouth 2 (two) times daily. 04/08/18   [provider]  Lancets (ACCU-CHEK MULTICLIX) lancets Use as instructed to test BG 4 x daily. [id number]  11/25/18   Cassandria Anger, MD  metFORMIN (GLUCOPHAGE) 1000 MG tablet Take 1 tablet (1,000 mg total) by mouth 2 (two) times daily with a meal. 11/25/18   Nida, Marella Chimes, MD  Prenatal 28-0.8 MG TABS 1 tablet Orally Once a day for 30 day(s)    [provider]  Semaglutide,0.25 or 0.'5MG'$ /DOS, (OZEMPIC, 0.25 OR 0.5 MG/DOSE,) 2 MG/1.5ML SOPN as directed: 0.'5mg'$  once weekly Subcutaneous Once Weekly for 30 days    [provider]  TRULICITY 1.5 0000000 SOPN ADMINISTER 1.5 MG UNDER THE SKIN 1 TIME A WEEK 07/12/22   Cassandria Anger, MD      Allergies    Patient has no known allergies.    Review of Systems   Review of Systems  Respiratory:  Positive for shortness of breath and wheezing.   All other systems reviewed and are negative.   Physical Exam Updated Vital Signs BP (!) 148/89 (BP Location: Right Arm)   Pulse 99   Temp 98.2 F (36.8 C) (Oral)   Resp (!) 24   Ht '5\' 9"'$  (1.753 m)   Wt 127 kg   SpO2 99%   BMI 41.35 kg/m  Physical Exam Vitals and nursing note reviewed.  Constitutional:      Appearance: Normal appearance. She is obese.  HENT:     Head: Normocephalic and atraumatic.  Right Ear: External ear normal.     Left Ear: External ear normal.     Nose: Nose normal.     Mouth/Throat:     Mouth: Mucous membranes are dry.  Eyes:     Extraocular Movements: Extraocular movements intact.     Conjunctiva/sclera: Conjunctivae normal.     Pupils: Pupils are equal, round, and reactive to light.  Cardiovascular:     Rate and Rhythm: Normal rate and regular rhythm.     Pulses: Normal pulses.     Heart sounds: Normal heart sounds.  Pulmonary:     Effort: Pulmonary effort is normal.     Breath sounds: Wheezing present.  Abdominal:     General: Abdomen is flat. Bowel sounds are normal.     Palpations: Abdomen is soft.  Musculoskeletal:        General: Normal range of motion.     Cervical back: Normal range of motion and neck supple.  Skin:     General: Skin is warm.     Capillary Refill: Capillary refill takes less than 2 seconds.  Neurological:     General: No focal deficit present.     Mental Status: She is alert and oriented to person, place, and time.  Psychiatric:        Mood and Affect: Mood normal.        Behavior: Behavior normal.     ED Results / Procedures / Treatments   Labs (all labs ordered are listed, but only abnormal results are displayed) Labs Reviewed  BASIC METABOLIC PANEL - Abnormal; Notable for the following components:      Result Value   Sodium 132 (*)    Glucose, Bld 452 (*)    Calcium 8.8 (*)    All other components within normal limits  CBC WITH DIFFERENTIAL/PLATELET - Abnormal; Notable for the following components:   MCV 79.2 (*)    All other components within normal limits    EKG None  Radiology No results found.  Procedures Procedures    Medications Ordered in ED Medications  albuterol (VENTOLIN HFA) 108 (90 Base) MCG/ACT inhaler 1-2 puff (2 puffs Inhalation Provided for home use 10/27/22 2233)  aerochamber Z-Stat Plus/medium 1 each (has no administration in time range)  ipratropium-albuterol (DUONEB) 0.5-2.5 (3) MG/3ML nebulizer solution (  Not Given 10/27/22 2233)  insulin glargine-yfgn (SEMGLEE) injection 80 Units (has no administration in time range)  ipratropium-albuterol (DUONEB) 0.5-2.5 (3) MG/3ML nebulizer solution 3 mL (3 mLs Nebulization Given 10/27/22 2228)  albuterol (PROVENTIL) (2.5 MG/3ML) 0.083% nebulizer solution (2.5 mg  Given 10/27/22 2234)  methylPREDNISolone sodium succinate (SOLU-MEDROL) 125 mg/2 mL injection 125 mg (125 mg Intravenous Given 10/27/22 2310)    ED Course/ Medical Decision Making/ A&P                             Medical Decision Making Amount and/or Complexity of Data Reviewed Labs: ordered.  Risk Prescription drug management.   This patient presents to the ED for concern of sob, this involves an extensive number of treatment options, and is a  complaint that carries with it a high risk of complications and morbidity.  The differential diagnosis includes asthma exac, covid/flu/rsv, pna   Co morbidities that complicate the patient evaluation  asthma, htn, dm, and obesity.   Additional history obtained:  Additional history obtained from epic chart review External records from outside source obtained and reviewed including family   Lab  Tests:  I Ordered, and personally interpreted labs.  The pertinent results include:  cbc nl, bmp with glucose elevated at 452  Cardiac Monitoring:  The patient was maintained on a cardiac monitor.  I personally viewed and interpreted the cardiac monitored which showed an underlying rhythm of: nsr   Medicines ordered and prescription drug management:  I ordered medication including duoneb/alb/solumedrol  for asthma exac  Reevaluation of the patient after these medicines showed that the patient improved I have reviewed the patients home medicines and have made adjustments as needed  Problem List / ED Course:  Asthma exac:  sx better after nebs.  Pt is given an albuterol inhaler. Hyperglycemia:  pt is out of her lantus.  Pt given her evening dose and is given a 1 month supply.  She is to f/u with her endocrinologist.   Reevaluation:  After the interventions noted above, I reevaluated the patient and found that they have :improved   Social Determinants of Health:  Lives at home   Dispostion:  After consideration of the diagnostic results and the patients response to treatment, I feel that the patent would benefit from discharge with outpatient f/u.          Final Clinical Impression(s) / ED Diagnoses Final diagnoses:  Mild intermittent asthma with exacerbation  Poorly controlled type 2 diabetes mellitus ([place])    Rx / DC Orders ED Discharge Orders          Ordered    insulin glargine (LANTUS SOLOSTAR) 100 UNIT/ML Solostar Pen  Daily at bedtime       Note to Pharmacy:  This prescription was filled on 12/13/2020. Any refills authorized will be placed on file.   10/27/22 2321              Isla Pence, MD 10/27/22 2327

## 2023-06-01 ENCOUNTER — Other Ambulatory Visit: Payer: Self-pay

## 2023-06-01 ENCOUNTER — Emergency Department (HOSPITAL_COMMUNITY)
Admission: EM | Admit: 2023-06-01 | Discharge: 2023-06-01 | Disposition: A | Discharge status: Home / Self Care | Payer: Medicaid Other | Attending: Emergency Medicine | Admitting: Emergency Medicine

## 2023-06-01 DIAGNOSIS — Z79899 Other long term (current) drug therapy: Secondary | ICD-10-CM | POA: Diagnosis not present

## 2023-06-01 DIAGNOSIS — G43909 Migraine, unspecified, not intractable, without status migrainosus: Secondary | ICD-10-CM | POA: Insufficient documentation

## 2023-06-01 DIAGNOSIS — I10 Essential (primary) hypertension: Secondary | ICD-10-CM | POA: Diagnosis not present

## 2023-06-01 DIAGNOSIS — Z7984 Long term (current) use of oral hypoglycemic drugs: Secondary | ICD-10-CM | POA: Insufficient documentation

## 2023-06-01 DIAGNOSIS — E119 Type 2 diabetes mellitus without complications: Secondary | ICD-10-CM | POA: Insufficient documentation

## 2023-06-01 DIAGNOSIS — R519 Headache, unspecified: Secondary | ICD-10-CM | POA: Diagnosis present

## 2023-06-01 DIAGNOSIS — Z794 Long term (current) use of insulin: Secondary | ICD-10-CM | POA: Diagnosis not present

## 2023-06-01 MED ORDER — PROCHLORPERAZINE EDISYLATE 10 MG/2ML IJ SOLN
10.0000 mg | Freq: Once | INTRAMUSCULAR | Status: AC
  Administered 2023-06-01: 10 mg via INTRAVENOUS
  Filled 2023-06-01: qty 2

## 2023-06-01 MED ORDER — KETOROLAC TROMETHAMINE 15 MG/ML IJ SOLN
15.0000 mg | Freq: Once | INTRAMUSCULAR | Status: AC
  Administered 2023-06-01: 15 mg via INTRAVENOUS
  Filled 2023-06-01: qty 1

## 2023-06-01 MED ORDER — SODIUM CHLORIDE 0.9 % IV BOLUS
1000.0000 mL | Freq: Once | INTRAVENOUS | Status: AC
  Administered 2023-06-01: 1000 mL via INTRAVENOUS

## 2023-06-01 MED ORDER — DIPHENHYDRAMINE HCL 50 MG/ML IJ SOLN
25.0000 mg | Freq: Once | INTRAMUSCULAR | Status: AC
  Administered 2023-06-01: 25 mg via INTRAVENOUS
  Filled 2023-06-01: qty 1

## 2023-06-01 NOTE — ED Provider Notes (Signed)
[place] EMERGENCY DEPARTMENT AT [facility] Provider Note   CSN: [id number] Arrival date & time: 06/01/23  1948     History  Chief Complaint  Patient presents with   Migraine    Pt presents to ED with complaints of margarines for the past 4 days and elevated BP. Hx of HTN and DM    Ruth Campbell is a 37 y.o. female past medical history significant for obesity, diabetes, hypertension who presents with concern of migraines/other type of headache for around 4 days.  She reports it was not sudden onset, but gradual.  She denies any head injury, loss of consciousness.  She denies any nausea but does endorse some light sensitivity.  She endorses some increase stress recently.  She denies any change in her eating or drinking habits.   Migraine       Home Medications Prior to Admission medications   Medication Sig Start Date End Date Taking? Authorizing Provider  amLODipine (NORVASC) 10 MG tablet Take 10 mg by mouth daily.    [provider]  amoxicillin (AMOXIL) 500 MG capsule 1 capsule Orally Three times a day    [provider]  amoxicillin-clavulanate (AUGMENTIN) 875-125 MG tablet Take 1 tablet by mouth every 12 (twelve) hours. 04/05/22   Ajani Rineer H, PA-C  Blood Glucose Monitoring Suppl (ACCU-CHEK GUIDE ME) w/Device KIT 1 Piece by Does not apply route 4 (four) times daily. 03/02/21   Roma Kayser, MD  GLOBAL INJECT EASE INSULIN SYR 31G X 5/16" 0.5 ML MISC Inject 1 Syringe into the skin 2 (two) times daily. 03/13/20   [provider]  glucose blood (ACCU-CHEK GUIDE) test strip USE TO TEST BLOOD SUGAR FOUR TIMES DAILY 02/21/21   Roma Kayser, MD  ibuprofen (ADVIL) 400 MG tablet  01/12/20   [provider]  ibuprofen (ADVIL) 800 MG tablet one tablet By Mouth three times daily prn for 30 days    [provider]  insulin glargine (LANTUS SOLOSTAR) 100 UNIT/ML Solostar Pen Inject 80 Units into the skin at  bedtime. 10/27/22   Jacalyn Lefevre, MD  Insulin Pen Needle (B-D ULTRAFINE III SHORT PEN) 31G X 8 MM MISC 1 each by Does not apply route as directed. 10/15/20   Roma Kayser, MD  labetalol (NORMODYNE) 100 MG tablet Take 100 mg by mouth 2 (two) times daily. 04/08/18   [provider]  Lancets (ACCU-CHEK MULTICLIX) lancets Use as instructed to test BG 4 x daily. W09,81 11/25/18   Roma Kayser, MD  metFORMIN (GLUCOPHAGE) 1000 MG tablet Take 1 tablet (1,000 mg total) by mouth 2 (two) times daily with a meal. 11/25/18   Nida, Denman George, MD  Prenatal 28-0.8 MG TABS 1 tablet Orally Once a day for 30 day(s)    [provider]  Semaglutide,0.25 or 0.5MG /DOS, (OZEMPIC, 0.25 OR 0.5 MG/DOSE,) 2 MG/1.5ML SOPN as directed: 0.5mg  once weekly Subcutaneous Once Weekly for 30 days    [provider]  TRULICITY 1.5 MG/0.5ML SOPN ADMINISTER 1.5 MG UNDER THE SKIN 1 TIME A WEEK 07/12/22   Roma Kayser, MD      Allergies    Patient has no known allergies.    Review of Systems   Review of Systems  All other systems reviewed and are negative.   Physical Exam Updated Vital Signs BP 134/88   Pulse 90   Temp 98.3 F (36.8 C) (Oral)   Resp 18   Ht 5\' 9"  (1.753 m)  Wt 127 kg   SpO2 97%   BMI 41.35 kg/m  Physical Exam Vitals and nursing note reviewed.  Constitutional:      General: She is not in acute distress.    Appearance: Normal appearance.  HENT:     Head: Normocephalic and atraumatic.  Eyes:     General:        Right eye: No discharge.        Left eye: No discharge.  Cardiovascular:     Rate and Rhythm: Normal rate and regular rhythm.     Heart sounds: No murmur heard.    No friction rub. No gallop.  Pulmonary:     Effort: Pulmonary effort is normal.     Breath sounds: Normal breath sounds.  Abdominal:     General: Bowel sounds are normal.     Palpations: Abdomen is soft.  Skin:    General: Skin is warm and dry.     Capillary Refill:  Capillary refill takes less than 2 seconds.  Neurological:     Mental Status: She is alert and oriented to person, place, and time.     Comments: Cranial nerves II through XII grossly intact.  Intact finger-nose, intact heel-to-shin.  Romberg negative, gait normal.  Alert and oriented x3.  Moves all 4 limbs spontaneously, normal coordination.  No pronator drift.  Intact strength 5 out of 5 bilateral upper and lower extremities.    Psychiatric:        Mood and Affect: Mood normal.        Behavior: Behavior normal.     ED Results / Procedures / Treatments   Labs (all labs ordered are listed, but only abnormal results are displayed) Labs Reviewed - No data to display  EKG None  Radiology No results found.  Procedures Procedures    Medications Ordered in ED Medications  sodium chloride 0.9 % bolus 1,000 mL (0 mLs Intravenous Stopped 06/01/23 2305)  prochlorperazine (COMPAZINE) injection 10 mg (10 mg Intravenous Given 06/01/23 2213)  diphenhydrAMINE (BENADRYL) injection 25 mg (25 mg Intravenous Given 06/01/23 2214)  ketorolac (TORADOL) 15 MG/ML injection 15 mg (15 mg Intravenous Given 06/01/23 2214)    ED Course/ Medical Decision Making/ A&P                                 Medical Decision Making Risk Prescription drug management.   This patient is a 37 y.o. female  who presents to the ED for concern of headache.   Differential diagnoses prior to evaluation: The emergent differential diagnosis includes, but is not limited to,  Stroke, increased ICP, meningitis, CVA, intracranial tumor, venous sinus thrombosis, migraine, cluster headache, hypertension, drug related, head injury, tension headache, sinusitis, dental abscess, otitis media, TMJ. This is not an exhaustive differential.  Past Medical History / Co-morbidities / Social History: Obesity, diabetes, hypertension, migraines  Physical Exam: Physical exam performed. The pertinent findings include: Patient with stable  vital signs, no focal neurologic deficits, findings consistent with acute migraine versus tension type headache.  Lab Tests/Imaging studies: Discussed with patient, I do not think that lab work is particularly needed at this time, will treat with migraine cocktail, and reassess  Medications: I ordered medication including migraine cocktail, on reassessment patient reports significant improvement, stable for discharge at this time.  I have reviewed the patients home medicines and have made adjustments as needed.   Disposition: After consideration of the diagnostic results  and the patients response to treatment, I feel that patient's symptoms are consistent with acute migraine/tension type headache, relieved with migraine cocktail, no focal neurologic deficits, stable for discharge, encouraged close PCP follow-up.   emergency department workup does not suggest an emergent condition requiring admission or immediate intervention beyond what has been performed at this time. The plan is: as above. The patient is safe for discharge and has been instructed to return immediately for worsening symptoms, change in symptoms or any other concerns.  Final Clinical Impression(s) / ED Diagnoses Final diagnoses:  Migraine without status migrainosus, not intractable, unspecified migraine type    Rx / DC Orders ED Discharge Orders     None         [place] 06/01/23 2307    Terrilee Files, MD 06/02/23 1009

## 2023-06-01 NOTE — Discharge Instructions (Signed)
Please use Tylenol or ibuprofen for pain.  You may use 600 mg ibuprofen every 6 hours or 1000 mg of Tylenol every 6 hours.  You may choose to alternate between the 2.  This would be most effective.  Not to exceed 4 g of Tylenol within 24 hours.  Not to exceed 3200 mg ibuprofen 24 hours.  

## 2023-06-01 NOTE — ED Triage Notes (Signed)
Pt presents to ED with complaints of margarines for the past 4 days and elevated BP. Hx of HTN and DM

## 2024-09-19 ENCOUNTER — Encounter: Payer: Self-pay | Admitting: Emergency Medicine

## 2024-09-19 ENCOUNTER — Ambulatory Visit
Admission: EM | Admit: 2024-09-19 | Discharge: 2024-09-19 | Disposition: A | Discharge status: Home / Self Care | Attending: Physician Assistant | Admitting: Physician Assistant

## 2024-09-19 DIAGNOSIS — R197 Diarrhea, unspecified: Secondary | ICD-10-CM | POA: Diagnosis not present

## 2024-09-19 DIAGNOSIS — R112 Nausea with vomiting, unspecified: Secondary | ICD-10-CM | POA: Diagnosis not present

## 2024-09-19 LAB — POC COVID19/FLU A&B COMBO
Covid Antigen, POC: NEGATIVE
Influenza A Antigen, POC: NEGATIVE
Influenza B Antigen, POC: NEGATIVE

## 2024-09-19 NOTE — Discharge Instructions (Signed)
 Return if any problems.

## 2024-09-19 NOTE — ED Provider Notes (Signed)
 " [facility]    CSN: [id number] Arrival date & time: 09/19/24  1308      History   Chief Complaint No chief complaint on file.   HPI Ruth Campbell is a 39 y.o. female.   Patient complains of nausea vomiting and diarrhea.  Patient reports symptoms began this a.m.  Patient wants to make sure that she does not have flu or COVID.  Patient reports she has been able to drink fluids and keep them down.  Patient states she has had some mild abdominal discomfort no severe pain.  Patient denies any burning with urination.  Patient's blood pressure is high she states she has not taking her blood pressure medication today she is going to pick it up at the pharmacy.  The history is provided by the patient. No language interpreter was used.    Past Medical History:  Diagnosis Date   Asthma    Diabetes mellitus without complication (HCC)    Hypertension    Obesity     Patient Active Problem List   Diagnosis Date Noted   Diabetes mellitus (HCC) 10/27/2022   Asthma 10/27/2022   Non-adherence to medical treatment 02/21/2021   Mixed hyperlipidemia 11/19/2020   Perianal abscess 03/26/2020   Vitamin D  deficiency 11/25/2018   Morbid obesity (HCC) 10/31/2016   Type 2 diabetes mellitus without complication, without long-term current use of insulin  (HCC) 10/31/2016   Essential hypertension 10/31/2016   Irregular periods 07/10/2016    History reviewed. No pertinent surgical history.  OB History     Gravida  0   Para  0   Term  0   Preterm  0   AB  0   Living  0      SAB  0   IAB  0   Ectopic  0   Multiple  0   Live Births  0            Home Medications    Prior to Admission medications  Medication Sig Start Date End Date Taking? Authorizing Provider  amLODipine (NORVASC) 10 MG tablet Take 10 mg by mouth daily.    [provider]  Blood Glucose Monitoring Suppl (ACCU-CHEK GUIDE ME) w/Device KIT 1 Piece by Does not apply route 4 (four)  times daily. 03/02/21   Nida, Gebreselassie W, MD  GLOBAL INJECT EASE INSULIN  SYR 31G X 5/16 0.5 ML MISC Inject 1 Syringe into the skin 2 (two) times daily. 03/13/20   [provider]  glucose blood (ACCU-CHEK GUIDE) test strip USE TO TEST BLOOD SUGAR FOUR TIMES DAILY 02/21/21   Nida, Gebreselassie W, MD  ibuprofen  (ADVIL ) 400 MG tablet  01/12/20   [provider]  ibuprofen  (ADVIL ) 800 MG tablet one tablet By Mouth three times daily prn for 30 days    [provider]  insulin  glargine (LANTUS  SOLOSTAR) 100 UNIT/ML Solostar Pen Inject 80 Units into the skin at bedtime. 10/27/22   Dean Clarity, MD  Insulin  Pen Needle (B-D ULTRAFINE III SHORT PEN) 31G X 8 MM MISC 1 each by Does not apply route as directed. 10/15/20   Nida, Gebreselassie W, MD  labetalol (NORMODYNE) 100 MG tablet Take 100 mg by mouth 2 (two) times daily. 04/08/18   [provider]  Lancets (ACCU-CHEK MULTICLIX) lancets Use as instructed to test BG 4 x daily. Z88,34 11/25/18   Nida, Gebreselassie W, MD  metFORMIN  (GLUCOPHAGE ) 1000 MG tablet Take 1 tablet (1,000 mg total) by mouth 2 (two) times  daily with a meal. 11/25/18   Nida, Ethelle ORN, MD  Prenatal 28-0.8 MG TABS 1 tablet Orally Once a day for 30 day(s)    [provider]  TRULICITY  1.5 MG/0.5ML SOPN ADMINISTER 1.5 MG UNDER THE SKIN 1 TIME A WEEK 07/12/22   Nida, Ethelle ORN, MD    Family History Family History  Problem Relation Age of Onset   Cancer Paternal Grandmother        bone   Cancer Father        prostate   Hypertension Father    Hypertension Mother    Heart disease Mother     Social History Social History[1]   Allergies   Patient has no known allergies.   Review of Systems Review of Systems  Gastrointestinal:  Positive for diarrhea, nausea and vomiting.  All other systems reviewed and are negative.    Physical Exam Triage Vital Signs ED Triage Vitals  Encounter Vitals Group     BP 09/19/24 1323 (!)  161/101     Girls Systolic BP Percentile --      Girls Diastolic BP Percentile --      Boys Systolic BP Percentile --      Boys Diastolic BP Percentile --      Pulse Rate 09/19/24 1323 92     Resp 09/19/24 1323 18     Temp 09/19/24 1323 98.3 F (36.8 C)     Temp Source 09/19/24 1323 Oral     SpO2 09/19/24 1323 96 %     Weight --      Height --      Head Circumference --      Peak Flow --      Pain Score 09/19/24 1324 4     Pain Loc --      Pain Education --      Exclude from Growth Chart --    No data found.  Updated Vital Signs BP (!) 161/101 (BP Location: Right Arm)   Pulse 92   Temp 98.3 F (36.8 C) (Oral)   Resp 18   LMP 09/12/2024 (Approximate)   SpO2 96%   Visual Acuity Right Eye Distance:   Left Eye Distance:   Bilateral Distance:    Right Eye Near:   Left Eye Near:    Bilateral Near:     Physical Exam Vitals and nursing note reviewed.  Constitutional:      Appearance: She is well-developed.  HENT:     Head: Normocephalic.  Cardiovascular:     Rate and Rhythm: Normal rate.  Pulmonary:     Effort: Pulmonary effort is normal.  Abdominal:     General: Abdomen is flat. There is no distension.     Palpations: Abdomen is soft.  Musculoskeletal:        General: Normal range of motion.     Cervical back: Normal range of motion.  Skin:    General: Skin is warm.  Neurological:     General: No focal deficit present.     Mental Status: She is alert and oriented to person, place, and time.      UC Treatments / Results  Labs (all labs ordered are listed, but only abnormal results are displayed) Labs Reviewed  POC COVID19/FLU A&B COMBO - Normal    EKG   Radiology No results found.  Procedures Procedures (including critical care time)  Medications Ordered in UC Medications - No data to display  Initial Impression / Assessment and Plan / UC  Course  I have reviewed the triage vital signs and the nursing notes.  Pertinent labs & imaging  results that were available during my care of the patient were reviewed by me and considered in my medical decision making (see chart for details).     Patient has symptoms consistent with a viral GI illness.  Patient is counseled on keeping herself well-hydrated.  She is advised to return for recheck if symptoms worsen or change. Final Clinical Impressions(s) / UC Diagnoses   Final diagnoses:  Nausea and vomiting, unspecified vomiting type  Diarrhea, unspecified type     Discharge Instructions      Return if any problems.   ED Prescriptions   None    PDMP not reviewed this encounter. An After Visit Summary was printed and given to the patient.        [1]  Social History Tobacco Use   Smoking status: Never   Smokeless tobacco: Never  Vaping Use   Vaping status: Never Used  Substance Use Topics   Alcohol use: No   Drug use: Yes    Types: Marijuana    Comment: last used 2014      [place] Sonny POUR, [place] 09/19/24 1423  "

## 2024-09-19 NOTE — ED Triage Notes (Signed)
 Headache, nausea, diarrhea and lower stomach pain since this morning
